# Patient Record
Sex: Male | Born: 1960 | Hispanic: Yes | Marital: Married | Smoking: Never smoker
Health system: Southern US, Community
[De-identification: ages and names within clinical notes are randomized; demographics above are authoritative.]

## PROBLEM LIST (undated history)

## (undated) DIAGNOSIS — I5032 Chronic diastolic (congestive) heart failure: Secondary | ICD-10-CM

## (undated) DIAGNOSIS — N186 End stage renal disease: Secondary | ICD-10-CM

## (undated) DIAGNOSIS — E785 Hyperlipidemia, unspecified: Secondary | ICD-10-CM

## (undated) DIAGNOSIS — E119 Type 2 diabetes mellitus without complications: Secondary | ICD-10-CM

## (undated) DIAGNOSIS — I10 Essential (primary) hypertension: Secondary | ICD-10-CM

## (undated) HISTORY — PX: AV FISTULA PLACEMENT, BRACHIOBASILIC: SHX1206

---

## 2003-11-27 ENCOUNTER — Emergency Department: Payer: Self-pay | Admitting: Internal Medicine

## 2008-08-29 ENCOUNTER — Emergency Department: Payer: Self-pay | Admitting: Unknown Physician Specialty

## 2012-10-06 ENCOUNTER — Emergency Department: Payer: Self-pay | Admitting: Emergency Medicine

## 2012-10-06 LAB — COMPREHENSIVE METABOLIC PANEL
Albumin: 4 g/dL (ref 3.4–5.0)
Alkaline Phosphatase: 111 U/L (ref 50–136)
Anion Gap: 7 (ref 7–16)
BUN: 19 mg/dL — ABNORMAL HIGH (ref 7–18)
Bilirubin,Total: 0.4 mg/dL (ref 0.2–1.0)
Calcium, Total: 9.9 mg/dL (ref 8.5–10.1)
Chloride: 99 mmol/L (ref 98–107)
Co2: 25 mmol/L (ref 21–32)
Creatinine: 0.84 mg/dL (ref 0.60–1.30)
EGFR (African American): >60
Glucose: 353 mg/dL — ABNORMAL HIGH (ref 65–99)
Potassium: 4.3 mmol/L (ref 3.5–5.1)
SGOT(AST): 16 U/L (ref 15–37)
SGPT (ALT): 21 U/L (ref 12–78)

## 2012-10-06 LAB — CBC WITH DIFFERENTIAL/PLATELET
Basophil #: 0.1 10*3/uL (ref 0.0–0.1)
Eosinophil #: 0.1 10*3/uL (ref 0.0–0.7)
Eosinophil %: 1.8 %
HCT: 50 % (ref 40.0–52.0)
HGB: 17.4 g/dL (ref 13.0–18.0)
MCH: 30.6 pg (ref 26.0–34.0)
MCV: 88 fL (ref 80–100)
Monocyte %: 9 %
Neutrophil #: 4 10*3/uL (ref 1.4–6.5)
Neutrophil %: 63.5 %
Platelet: 209 10*3/uL (ref 150–440)
RBC: 5.67 10*6/uL (ref 4.40–5.90)
RDW: 13.7 % (ref 11.5–14.5)
WBC: 6.3 10*3/uL (ref 3.8–10.6)

## 2013-02-07 ENCOUNTER — Ambulatory Visit: Payer: Self-pay | Admitting: Orthopedic Surgery

## 2013-09-19 ENCOUNTER — Emergency Department: Payer: Self-pay | Admitting: Emergency Medicine

## 2013-09-19 LAB — COMPREHENSIVE METABOLIC PANEL
ALBUMIN: 3.8 g/dL (ref 3.4–5.0)
ANION GAP: 11 (ref 7–16)
Alkaline Phosphatase: 85 U/L
BILIRUBIN TOTAL: 0.8 mg/dL (ref 0.2–1.0)
BUN: 18 mg/dL (ref 7–18)
CO2: 25 mmol/L (ref 21–32)
Calcium, Total: 9.2 mg/dL (ref 8.5–10.1)
Chloride: 100 mmol/L (ref 98–107)
Creatinine: 0.87 mg/dL (ref 0.60–1.30)
EGFR (African American): >60
EGFR (Non-African Amer.): >60
GLUCOSE: 224 mg/dL — AB (ref 65–99)
OSMOLALITY: 281 (ref 275–301)
POTASSIUM: 3.7 mmol/L (ref 3.5–5.1)
SGOT(AST): 11 U/L — ABNORMAL LOW (ref 15–37)
SGPT (ALT): 16 U/L
Sodium: 136 mmol/L (ref 136–145)
TOTAL PROTEIN: 7.5 g/dL (ref 6.4–8.2)

## 2013-09-19 LAB — CBC WITH DIFFERENTIAL/PLATELET
Basophil #: 0 10*3/uL (ref 0.0–0.1)
Basophil %: 0.3 %
EOS ABS: 0.1 10*3/uL (ref 0.0–0.7)
Eosinophil %: 0.4 %
HCT: 45.2 % (ref 40.0–52.0)
HGB: 15.7 g/dL (ref 13.0–18.0)
LYMPHS PCT: 7.1 %
Lymphocyte #: 1 10*3/uL (ref 1.0–3.6)
MCH: 30.8 pg (ref 26.0–34.0)
MCHC: 34.8 g/dL (ref 32.0–36.0)
MCV: 89 fL (ref 80–100)
MONOS PCT: 10.1 %
Monocyte #: 1.4 x10 3/mm — ABNORMAL HIGH (ref 0.2–1.0)
Neutrophil #: 11.1 10*3/uL — ABNORMAL HIGH (ref 1.4–6.5)
Neutrophil %: 82.1 %
Platelet: 167 10*3/uL (ref 150–440)
RBC: 5.11 10*6/uL (ref 4.40–5.90)
RDW: 14.1 % (ref 11.5–14.5)
WBC: 13.6 10*3/uL — AB (ref 3.8–10.6)

## 2014-02-27 DIAGNOSIS — E118 Type 2 diabetes mellitus with unspecified complications: Secondary | ICD-10-CM | POA: Diagnosis present

## 2017-06-01 DIAGNOSIS — I10 Essential (primary) hypertension: Secondary | ICD-10-CM | POA: Diagnosis present

## 2018-09-12 ENCOUNTER — Other Ambulatory Visit: Payer: Self-pay

## 2018-09-12 DIAGNOSIS — Z20822 Contact with and (suspected) exposure to covid-19: Secondary | ICD-10-CM

## 2018-09-13 LAB — NOVEL CORONAVIRUS, NAA: SARS-CoV-2, NAA: NOT DETECTED

## 2018-10-15 ENCOUNTER — Other Ambulatory Visit: Payer: Self-pay

## 2018-10-15 DIAGNOSIS — Z20822 Contact with and (suspected) exposure to covid-19: Secondary | ICD-10-CM

## 2018-10-16 LAB — NOVEL CORONAVIRUS, NAA: SARS-CoV-2, NAA: NOT DETECTED

## 2021-01-05 ENCOUNTER — Encounter: Payer: Self-pay | Admitting: Emergency Medicine

## 2021-01-05 ENCOUNTER — Emergency Department
Admission: EM | Admit: 2021-01-05 | Discharge: 2021-01-05 | Disposition: A | Discharge status: Home / Self Care | Payer: 59 | Attending: Emergency Medicine | Admitting: Emergency Medicine

## 2021-01-05 ENCOUNTER — Emergency Department: Payer: 59

## 2021-01-05 ENCOUNTER — Other Ambulatory Visit: Payer: Self-pay

## 2021-01-05 DIAGNOSIS — M545 Low back pain, unspecified: Secondary | ICD-10-CM | POA: Insufficient documentation

## 2021-01-05 DIAGNOSIS — M5441 Lumbago with sciatica, right side: Secondary | ICD-10-CM

## 2021-01-05 DIAGNOSIS — I10 Essential (primary) hypertension: Secondary | ICD-10-CM | POA: Diagnosis not present

## 2021-01-05 DIAGNOSIS — E119 Type 2 diabetes mellitus without complications: Secondary | ICD-10-CM | POA: Diagnosis not present

## 2021-01-05 DIAGNOSIS — M79604 Pain in right leg: Secondary | ICD-10-CM | POA: Insufficient documentation

## 2021-01-05 HISTORY — DX: Essential (primary) hypertension: I10

## 2021-01-05 HISTORY — DX: Hyperlipidemia, unspecified: E78.5

## 2021-01-05 HISTORY — DX: Type 2 diabetes mellitus without complications: E11.9

## 2021-01-05 MED ORDER — PREDNISONE 20 MG PO TABS: 40.0000 mg | ORAL_TABLET | Freq: Every day | ORAL | 0 refills | Status: AC

## 2021-01-05 MED ORDER — IBUPROFEN 600 MG PO TABS: 600.0000 mg | ORAL_TABLET | Freq: Four times a day (QID) | ORAL | 0 refills | Status: DC | PRN

## 2021-01-05 MED ORDER — TRAMADOL HCL 50 MG PO TABS
50.0000 mg | ORAL_TABLET | Freq: Once | ORAL | Status: AC
  Administered 2021-01-05: 08:00:00 50 mg via ORAL
  Filled 2021-01-05: qty 1

## 2021-01-05 MED ORDER — IBUPROFEN 600 MG PO TABS
600.0000 mg | ORAL_TABLET | Freq: Once | ORAL | Status: AC
  Administered 2021-01-05: 08:00:00 600 mg via ORAL
  Filled 2021-01-05: qty 1

## 2021-01-05 MED ORDER — TRAMADOL HCL 50 MG PO TABS: 50.0000 mg | ORAL_TABLET | Freq: Four times a day (QID) | ORAL | 0 refills | Status: AC | PRN

## 2021-01-05 NOTE — ED Provider Notes (Signed)
[facility] Emergency Department Provider Note ____________________________________________   Event Date/Time   First MD Initiated Contact with Patient 01/05/21 [phone]     (approximate)  I have reviewed the triage vital signs and the nursing notes.   HISTORY  Chief Complaint Back Pain  HPI and ROS obtained via interpretation by the patient's son at the patient's request  HPI Joshua Galvan is a 60 y.o. male with PMH as noted below who presents with back pain, gradual onset yesterday but acutely worsened early this morning around 1 AM.  The pain is in the bilateral lower back although worse on the right.  It radiates down to the right leg and he feels some tingling and numbness in the right leg.  He denies any weakness in the legs and has no urinary retention or incontinence.  He denies any preceding trauma or injury.  He has had occasional milder back pain.  He has not taken anything for this at home.  Past Medical History:  Diagnosis Date   Diabetes mellitus without complication (HCC)    Hyperlipidemia    Hypertension     There are no problems to display for this patient.   History reviewed. No pertinent surgical history.  Prior to Admission medications   Medication Sig Start Date End Date Taking? Authorizing Provider  ibuprofen (ADVIL) 600 MG tablet Take 1 tablet (600 mg total) by mouth every 6 (six) hours as needed. 01/05/21  Yes Dionne Bucy, MD  predniSONE (DELTASONE) 20 MG tablet Take 2 tablets (40 mg total) by mouth daily for 5 days. 01/05/21 01/10/21 Yes Dionne Bucy, MD  traMADol (ULTRAM) 50 MG tablet Take 1 tablet (50 mg total) by mouth every 6 (six) hours as needed for up to 3 days for severe pain. 01/05/21 01/08/21 Yes Dionne Bucy, MD    Allergies Patient has no known allergies.  History reviewed. No pertinent family history.  Social History Social History   Tobacco Use   Smoking status: Never    Smokeless tobacco: Never  Substance Use Topics   Alcohol use: Yes    Comment: weekends   Drug use: Never    Review of Systems  Constitutional: No fever/chills Eyes: No visual changes. ENT: No sore throat. Cardiovascular: Denies chest pain. Respiratory: Denies shortness of breath. Gastrointestinal: No vomiting or diarrhea.  Genitourinary: Negative for dysuria, incontinence or retention.  Musculoskeletal: Positive for back pain. Skin: Negative for rash. Neurological: Positive for right leg paresthesias.  ____________________________________________   PHYSICAL EXAM:  VITAL SIGNS: ED Triage Vitals  Enc Vitals Group     BP 01/05/21 0648 (!) 182/98     Pulse Rate 01/05/21 0648 88     Resp 01/05/21 0648 18     Temp 01/05/21 0648 98.9 F (37.2 C)     Temp Source 01/05/21 0648 Oral     SpO2 01/05/21 0648 97 %     Weight 01/05/21 0648 170 lb (77.1 kg)     Height 01/05/21 0648 5\' 11"  (1.803 m)     Head Circumference --      Peak Flow --      Pain Score 01/05/21 0648 9     Pain Loc --      Pain Edu? --      Excl. in GC? --     Constitutional: Alert and oriented. Well appearing and in no acute distress. Eyes: Conjunctivae are normal.  Head: Atraumatic. Nose: No congestion/rhinnorhea. Mouth/Throat: Mucous membranes are moist.   Neck: Normal  range of motion.  Cardiovascular: Normal rate, regular rhythm. Good peripheral circulation. Respiratory: Normal respiratory effort.   Gastrointestinal: No distention.  Genitourinary: No CVA tenderness. Musculoskeletal: Mild lumbar midline and bilateral paraspinal tenderness.  No step-off or crepitus.  Negative straight leg raise bilaterally.  Full range of motion of bilateral hips and knees. Neurologic:  Normal speech and language.  5/5 motor strength and intact sensation proximal and distal to bilateral lower extremities. Skin:  Skin is warm and dry. No rash noted. Psychiatric: Mood and affect are normal. Speech and behavior are  normal.  ____________________________________________   LABS (all labs ordered are listed, but only abnormal results are displayed)  Labs Reviewed - No data to display ____________________________________________  EKG   ____________________________________________  RADIOLOGY  XR lumbar spine:   IMPRESSION:  Degenerative changes lumbar spine.     No acute abnormalities.   ____________________________________________   PROCEDURES  Procedure(s) performed: No  Procedures  Critical Care performed: No ____________________________________________   INITIAL IMPRESSION / ASSESSMENT AND PLAN / ED COURSE  Pertinent labs & imaging results that were available during my care of the patient were reviewed by me and considered in my medical decision making (see chart for details).   60 year old male with PMH as noted above presents with atraumatic lumbar pain radiating down his right leg and associated with some paresthesias.  On exam he is hypertensive with otherwise normal vital signs.  Straight leg raise test is negative bilaterally, and neurologic exam is normal.  Differential includes muscle strain or spasm, radiculopathy, sciatica.  The patient has no red flags and no clinical evidence of cauda equina syndrome.  We will obtain x-rays of the lumbar spine and give analgesia and steroid.  ----------------------------------------- 8:57 AM on 01/05/2021 -----------------------------------------  X-ray just shows some degenerative changes.  The patient is stable for discharge home.  I counseled him and his son on the results of the work-up and plan of care.  I will prescribe prednisone and ibuprofen, along with a small quantity of tramadol for breakthrough pain.  I gave them very thorough return precautions and they expressed understanding.  Patient will follow up with his PMD.  ____________________________________________   FINAL CLINICAL IMPRESSION(S) / ED DIAGNOSES  Final  diagnoses:  Acute bilateral low back pain with right-sided sciatica      NEW MEDICATIONS STARTED DURING THIS VISIT:  New Prescriptions   IBUPROFEN (ADVIL) 600 MG TABLET    Take 1 tablet (600 mg total) by mouth every 6 (six) hours as needed.   PREDNISONE (DELTASONE) 20 MG TABLET    Take 2 tablets (40 mg total) by mouth daily for 5 days.   TRAMADOL (ULTRAM) 50 MG TABLET    Take 1 tablet (50 mg total) by mouth every 6 (six) hours as needed for up to 3 days for severe pain.     Note:  This document was prepared using Dragon voice recognition software and may include unintentional dictation errors.    Dionne Bucy, MD 01/05/21 [phone]

## 2021-01-05 NOTE — Discharge Instructions (Addendum)
Tome la prednisona diariamente durante 5 das para disminuir la inflamacin. Tome el ibuprofeno hasta cada 6 horas para Chief Technology Officer. Tome el tramadol solo si es necesario para Chief Technology Officer que no se alivia con los otros medicamentos. No conduzca ni maneje maquinaria mientras est tomando tramadol.

## 2021-01-05 NOTE — ED Triage Notes (Signed)
Pt to ED from home c/o lower back pain since yesterday and getting worse today.  States pain radiates down right leg.  Gets some relief after moving around some.  Denies injury, denies urinary changes.  Pt A&Ox4, chest rise even and unlabored, skin WNL and in NAD at this time.

## 2022-02-11 DIAGNOSIS — N1831 Chronic kidney disease, stage 3a: Secondary | ICD-10-CM | POA: Diagnosis present

## 2022-02-11 DIAGNOSIS — E785 Hyperlipidemia, unspecified: Secondary | ICD-10-CM | POA: Diagnosis present

## 2023-02-26 ENCOUNTER — Emergency Department: Payer: Commercial Managed Care - PPO

## 2023-02-26 ENCOUNTER — Other Ambulatory Visit: Payer: Self-pay

## 2023-02-26 DIAGNOSIS — Z20822 Contact with and (suspected) exposure to covid-19: Secondary | ICD-10-CM | POA: Insufficient documentation

## 2023-02-26 DIAGNOSIS — J101 Influenza due to other identified influenza virus with other respiratory manifestations: Secondary | ICD-10-CM | POA: Insufficient documentation

## 2023-02-26 DIAGNOSIS — R059 Cough, unspecified: Secondary | ICD-10-CM | POA: Diagnosis present

## 2023-02-26 NOTE — ED Triage Notes (Signed)
Pt developed cough congestion sore throat for the past 8 days.

## 2023-02-27 ENCOUNTER — Emergency Department
Admission: EM | Admit: 2023-02-27 | Discharge: 2023-02-27 | Disposition: A | Discharge status: Home / Self Care | Payer: Commercial Managed Care - PPO | Attending: Emergency Medicine | Admitting: Emergency Medicine

## 2023-02-27 DIAGNOSIS — J101 Influenza due to other identified influenza virus with other respiratory manifestations: Secondary | ICD-10-CM

## 2023-02-27 LAB — GROUP A STREP BY PCR: Group A Strep by PCR: NOT DETECTED

## 2023-02-27 LAB — RESP PANEL BY RT-PCR (RSV, FLU A&B, COVID)  RVPGX2
Influenza A by PCR: POSITIVE — AB
Influenza B by PCR: NEGATIVE
Resp Syncytial Virus by PCR: NEGATIVE
SARS Coronavirus 2 by RT PCR: NEGATIVE

## 2023-02-27 MED ORDER — KETOROLAC TROMETHAMINE 30 MG/ML IJ SOLN
30.0000 mg | Freq: Once | INTRAMUSCULAR | Status: AC
  Administered 2023-02-27: 30 mg via INTRAMUSCULAR
  Filled 2023-02-27: qty 1

## 2023-02-27 MED ORDER — BUTALBITAL-APAP-CAFFEINE 50-325-40 MG PO TABS: 1.0000 | ORAL_TABLET | Freq: Four times a day (QID) | ORAL | 0 refills | Status: AC | PRN

## 2023-02-27 NOTE — ED Provider Notes (Signed)
 [facility] Provider Note    Event Date/Time   First MD Initiated Contact with Patient 02/27/23 [phone]     (approximate)   History   Cough   HPI  Joshua Galvan is a 63 y.o. male who presents with complaints of mild cough, headache, sore throat, body aches, fatigue over the last 3 to 4 days.  Denies shortness of breath.  No neurodeficits.  Per review of records, chronic hypertension, poorly controlled     Physical Exam   Triage Vital Signs: ED Triage Vitals  Encounter Vitals Group     BP 02/26/23 2345 (!) 187/93     Systolic BP Percentile --      Diastolic BP Percentile --      Pulse Rate 02/26/23 2345 87     Resp 02/26/23 2345 20     Temp 02/26/23 2345 98.4 F (36.9 C)     Temp Source 02/26/23 2345 Oral     SpO2 02/26/23 2345 99 %     Weight 02/26/23 2344 79.4 kg (175 lb)     Height 02/26/23 2344 1.803 m (5' 11)     Head Circumference --      Peak Flow --      Pain Score 02/26/23 2343 9     Pain Loc --      Pain Education --      Exclude from Growth Chart --     Most recent vital signs: Vitals:   02/27/23 0414 02/27/23 0720  BP: (!) 189/96 (!) 180/90  Pulse: 88 80  Resp: 18 18  Temp: 99 F (37.2 C) 99.1 F (37.3 C)  SpO2: 99% 99%     General: Awake, no distress.  CV:  Good peripheral perfusion.  Regular rate and rhythm Resp:  Normal effort.  Clear to auscultation bilaterally Abd:  No distention.  Other:  Normal neurologic exam, cranial nerves II through XII are normal   ED Results / Procedures / Treatments   Labs (all labs ordered are listed, but only abnormal results are displayed) Labs Reviewed  RESP PANEL BY RT-PCR (RSV, FLU A&B, COVID)  RVPGX2 - Abnormal; Notable for the following components:      Result Value   Influenza A by PCR POSITIVE (*)    All other components within normal limits  GROUP A STREP BY PCR     EKG     RADIOLOGY Chest x-ray viewed interpreted by me, no  pneumonia    PROCEDURES:  Critical Care performed:   Procedures   MEDICATIONS ORDERED IN ED: Medications  ketorolac  (TORADOL ) 30 MG/ML injection 30 mg (30 mg Intramuscular Given 02/27/23 0742)     IMPRESSION / MDM / ASSESSMENT AND PLAN / ED COURSE  I reviewed the triage vital signs and the nursing notes. Patient's presentation is most consistent with acute illness / injury with system symptoms.  Patient presents with symptoms as above suspicious for viral illness, vital signs overall reassuring despite chronic hypertension, not significantly changed from prior  Neurologic exam reassuring, mild elevation of temperature, influenza A positive on PCR, strep negative, chest x-ray reassuring  Will treat with IM Toradol , appropriate for outpatient management, return precautions discussed, patient reports he takes losartan for his blood pressure, recommended close follow-up with PCP for recheck after feeling improved to reevaluate blood pressure.        FINAL CLINICAL IMPRESSION(S) / ED DIAGNOSES   Final diagnoses:  Influenza A     Rx / DC Orders  ED Discharge Orders     None        Note:  This document was prepared using Dragon voice recognition software and may include unintentional dictation errors.   Arlander Charleston, MD 02/27/23 [phone]

## 2023-03-01 ENCOUNTER — Emergency Department (HOSPITAL_COMMUNITY): Payer: Commercial Managed Care - PPO

## 2023-03-01 ENCOUNTER — Inpatient Hospital Stay (HOSPITAL_COMMUNITY)
Admission: EM | Admit: 2023-03-01 | Discharge: 2023-03-06 | DRG: 683 | Disposition: A | Discharge status: Home / Self Care | Payer: Commercial Managed Care - PPO | Attending: Internal Medicine | Admitting: Internal Medicine

## 2023-03-01 ENCOUNTER — Encounter (HOSPITAL_COMMUNITY): Payer: Self-pay | Admitting: Emergency Medicine

## 2023-03-01 DIAGNOSIS — E860 Dehydration: Secondary | ICD-10-CM | POA: Diagnosis present

## 2023-03-01 DIAGNOSIS — N170 Acute kidney failure with tubular necrosis: Principal | ICD-10-CM | POA: Diagnosis present

## 2023-03-01 DIAGNOSIS — I129 Hypertensive chronic kidney disease with stage 1 through stage 4 chronic kidney disease, or unspecified chronic kidney disease: Secondary | ICD-10-CM | POA: Diagnosis present

## 2023-03-01 DIAGNOSIS — J320 Chronic maxillary sinusitis: Secondary | ICD-10-CM | POA: Diagnosis present

## 2023-03-01 DIAGNOSIS — H70002 Acute mastoiditis without complications, left ear: Secondary | ICD-10-CM | POA: Diagnosis present

## 2023-03-01 DIAGNOSIS — E1122 Type 2 diabetes mellitus with diabetic chronic kidney disease: Secondary | ICD-10-CM | POA: Diagnosis present

## 2023-03-01 DIAGNOSIS — I10 Essential (primary) hypertension: Secondary | ICD-10-CM | POA: Diagnosis present

## 2023-03-01 DIAGNOSIS — E785 Hyperlipidemia, unspecified: Secondary | ICD-10-CM | POA: Diagnosis present

## 2023-03-01 DIAGNOSIS — J101 Influenza due to other identified influenza virus with other respiratory manifestations: Secondary | ICD-10-CM | POA: Diagnosis present

## 2023-03-01 DIAGNOSIS — E861 Hypovolemia: Secondary | ICD-10-CM | POA: Diagnosis present

## 2023-03-01 DIAGNOSIS — J323 Chronic sphenoidal sinusitis: Secondary | ICD-10-CM | POA: Diagnosis present

## 2023-03-01 DIAGNOSIS — E118 Type 2 diabetes mellitus with unspecified complications: Secondary | ICD-10-CM | POA: Diagnosis present

## 2023-03-01 DIAGNOSIS — N1831 Chronic kidney disease, stage 3a: Secondary | ICD-10-CM | POA: Diagnosis present

## 2023-03-01 DIAGNOSIS — N179 Acute kidney failure, unspecified: Principal | ICD-10-CM | POA: Diagnosis present

## 2023-03-01 LAB — CBC WITH DIFFERENTIAL/PLATELET
Abs Immature Granulocytes: 0.09 10*3/uL — ABNORMAL HIGH (ref 0.00–0.07)
Basophils Absolute: 0.1 10*3/uL (ref 0.0–0.1)
Basophils Relative: 1 %
Eosinophils Absolute: 0.2 10*3/uL (ref 0.0–0.5)
Eosinophils Relative: 2 %
HCT: 32 % — ABNORMAL LOW (ref 39.0–52.0)
Hemoglobin: 10.8 g/dL — ABNORMAL LOW (ref 13.0–17.0)
Immature Granulocytes: 1 %
Lymphocytes Relative: 12 %
Lymphs Abs: 1.6 10*3/uL (ref 0.7–4.0)
MCH: 29.7 pg (ref 26.0–34.0)
MCHC: 33.8 g/dL (ref 30.0–36.0)
MCV: 87.9 fL (ref 80.0–100.0)
Monocytes Absolute: 1.1 10*3/uL — ABNORMAL HIGH (ref 0.1–1.0)
Monocytes Relative: 9 %
Neutro Abs: 9.7 10*3/uL — ABNORMAL HIGH (ref 1.7–7.7)
Neutrophils Relative %: 75 %
Platelets: 306 10*3/uL (ref 150–400)
RBC: 3.64 MIL/uL — ABNORMAL LOW (ref 4.22–5.81)
RDW: 13.1 % (ref 11.5–15.5)
WBC: 12.8 10*3/uL — ABNORMAL HIGH (ref 4.0–10.5)
nRBC: 0 % (ref 0.0–0.2)

## 2023-03-01 LAB — HEPATIC FUNCTION PANEL
ALT: 12 U/L (ref 0–44)
AST: 15 U/L (ref 15–41)
Albumin: 2.4 g/dL — ABNORMAL LOW (ref 3.5–5.0)
Alkaline Phosphatase: 104 U/L (ref 38–126)
Bilirubin, Direct: <0.1 mg/dL (ref 0.0–0.2)
Total Bilirubin: 0.5 mg/dL (ref 0.0–1.2)
Total Protein: 6.4 g/dL — ABNORMAL LOW (ref 6.5–8.1)

## 2023-03-01 LAB — BASIC METABOLIC PANEL
Anion gap: 10 (ref 5–15)
BUN: 42 mg/dL — ABNORMAL HIGH (ref 8–23)
CO2: 19 mmol/L — ABNORMAL LOW (ref 22–32)
Calcium: 8.6 mg/dL — ABNORMAL LOW (ref 8.9–10.3)
Chloride: 107 mmol/L (ref 98–111)
Creatinine, Ser: 3.39 mg/dL — ABNORMAL HIGH (ref 0.61–1.24)
GFR, Estimated: 20 mL/min — ABNORMAL LOW (ref >60)
Glucose, Bld: 187 mg/dL — ABNORMAL HIGH (ref 70–99)
Potassium: 4.2 mmol/L (ref 3.5–5.1)
Sodium: 136 mmol/L (ref 135–145)

## 2023-03-01 LAB — GLUCOSE, CAPILLARY: Glucose-Capillary: 127 mg/dL — ABNORMAL HIGH (ref 70–99)

## 2023-03-01 MED ORDER — ACETAMINOPHEN 325 MG PO TABS
650.0000 mg | ORAL_TABLET | Freq: Four times a day (QID) | ORAL | Status: DC | PRN
  Administered 2023-03-02 (×2): 650 mg via ORAL
  Filled 2023-03-01 (×2): qty 2

## 2023-03-01 MED ORDER — ACETAMINOPHEN 500 MG PO TABS
1000.0000 mg | ORAL_TABLET | Freq: Once | ORAL | Status: AC
  Administered 2023-03-01: 1000 mg via ORAL
  Filled 2023-03-01: qty 2

## 2023-03-01 MED ORDER — HEPARIN SODIUM (PORCINE) 5000 UNIT/ML IJ SOLN
5000.0000 [IU] | Freq: Three times a day (TID) | INTRAMUSCULAR | Status: DC
  Administered 2023-03-01 – 2023-03-06 (×14): 5000 [IU] via SUBCUTANEOUS
  Filled 2023-03-01 (×14): qty 1

## 2023-03-01 MED ORDER — INSULIN ASPART 100 UNIT/ML IJ SOLN: 0.0000 [IU] | Freq: Every day | INTRAMUSCULAR | Status: DC

## 2023-03-01 MED ORDER — DOCUSATE SODIUM 283 MG RE ENEM: 1.0000 | ENEMA | RECTAL | Status: DC | PRN

## 2023-03-01 MED ORDER — NEPRO/CARBSTEADY PO LIQD: 237.0000 mL | Freq: Three times a day (TID) | ORAL | Status: DC | PRN

## 2023-03-01 MED ORDER — ACETAMINOPHEN 650 MG RE SUPP: 650.0000 mg | Freq: Four times a day (QID) | RECTAL | Status: DC | PRN

## 2023-03-01 MED ORDER — SODIUM CHLORIDE 0.9 % IV SOLN: INTRAVENOUS | Status: DC

## 2023-03-01 MED ORDER — CALCIUM CARBONATE ANTACID 1250 MG/5ML PO SUSP: 500.0000 mg | Freq: Four times a day (QID) | ORAL | Status: DC | PRN

## 2023-03-01 MED ORDER — LACTATED RINGERS IV BOLUS
1000.0000 mL | Freq: Once | INTRAVENOUS | Status: AC
  Administered 2023-03-01: 1000 mL via INTRAVENOUS

## 2023-03-01 MED ORDER — SODIUM CHLORIDE 0.9 % IV SOLN
1.0000 g | INTRAVENOUS | Status: DC
  Administered 2023-03-02: 1 g via INTRAVENOUS
  Filled 2023-03-01: qty 10

## 2023-03-01 MED ORDER — CAMPHOR-MENTHOL 0.5-0.5 % EX LOTN: 1.0000 | TOPICAL_LOTION | Freq: Three times a day (TID) | CUTANEOUS | Status: DC | PRN

## 2023-03-01 MED ORDER — ONDANSETRON HCL 4 MG PO TABS: 4.0000 mg | ORAL_TABLET | Freq: Four times a day (QID) | ORAL | Status: DC | PRN

## 2023-03-01 MED ORDER — ONDANSETRON HCL 4 MG/2ML IJ SOLN
4.0000 mg | Freq: Four times a day (QID) | INTRAMUSCULAR | Status: DC | PRN
  Administered 2023-03-04: 4 mg via INTRAVENOUS
  Filled 2023-03-01: qty 2

## 2023-03-01 MED ORDER — ACETAMINOPHEN 500 MG PO TABS
ORAL_TABLET | ORAL | Status: AC
  Filled 2023-03-01: qty 2

## 2023-03-01 MED ORDER — INSULIN ASPART 100 UNIT/ML IJ SOLN
0.0000 [IU] | Freq: Three times a day (TID) | INTRAMUSCULAR | Status: DC
  Administered 2023-03-02: 5 [IU] via SUBCUTANEOUS
  Administered 2023-03-02: 3 [IU] via SUBCUTANEOUS
  Administered 2023-03-03: 11 [IU] via SUBCUTANEOUS
  Administered 2023-03-04 (×2): 2 [IU] via SUBCUTANEOUS
  Administered 2023-03-04: 3 [IU] via SUBCUTANEOUS
  Administered 2023-03-05: 2 [IU] via SUBCUTANEOUS
  Administered 2023-03-05: 3 [IU] via SUBCUTANEOUS
  Administered 2023-03-05: 5 [IU] via SUBCUTANEOUS

## 2023-03-01 MED ORDER — ACETAMINOPHEN 500 MG PO TABS
1000.0000 mg | ORAL_TABLET | Freq: Once | ORAL | Status: DC
  Filled 2023-03-01: qty 2

## 2023-03-01 MED ORDER — HYDROXYZINE HCL 25 MG PO TABS: 25.0000 mg | ORAL_TABLET | Freq: Three times a day (TID) | ORAL | Status: DC | PRN

## 2023-03-01 MED ORDER — SORBITOL 70 % SOLN: 30.0000 mL | Status: DC | PRN

## 2023-03-01 NOTE — ED Triage Notes (Signed)
 Pt here sent from from MD office with c/o htn , pt recently dx with flu has not not missed any of his b/p meds

## 2023-03-01 NOTE — ED Provider Triage Note (Signed)
 Emergency Medicine Provider Triage Evaluation Note  Melody Huck Ashworth , a 63 y.o. male  was evaluated in triage.  Pt complains of acutely painful headache, congestion, cough x 4 days. Saw PCP today and told to get head CT.  Dx w/ flue 02/27/2023. Has taken all BP meds today.  Denies fevers, blurry vision, vertigo, chest pain, shortness of breath, abdominal pain, dysuria, hesitancy, LE pain, LE swelling, instability, weakness.   Review of Systems  Positive: N/a Negative: N/a  Physical Exam  BP (!) 194/94   Pulse 88   Temp 98.4 F (36.9 C)   Resp 18   SpO2 100%  Gen:   Awake, no distress   Resp:  Normal effort  MSK:   Moves extremities without difficulty  Other:    Medical Decision Making  Medically screening exam initiated at 12:46 PM.  Appropriate orders placed.  Treylin Rayner Erman was informed that the remainder of the evaluation will be completed by another provider, this initial triage assessment does not replace that evaluation, and the importance of remaining in the ED until their evaluation is complete.     Beola Terrall RAMAN, [place] 03/01/23 1253

## 2023-03-01 NOTE — ED Provider Notes (Signed)
 [name] EMERGENCY DEPARTMENT AT [facility] Provider Note   CSN: [id number] Arrival date & time: 03/01/23  1234     History Chief Complaint  Patient presents with   Influenza   Hypertension    HPI Joshua Galvan is a 63 y.o. male presenting for chief complaint of elevated BP. Recently diagnosed with flu. Has been having a substantial amount of headache. HX of HBP on   Patient's recorded medical, surgical, social, medication list and allergies were reviewed in the Snapshot window as part of the initial history.   Review of Systems   Review of Systems  Constitutional:  Positive for fatigue and fever. Negative for chills.  HENT:  Positive for congestion. Negative for ear pain and sore throat.   Eyes:  Negative for pain and visual disturbance.  Respiratory:  Positive for cough and shortness of breath.   Cardiovascular:  Negative for chest pain and palpitations.  Gastrointestinal:  Negative for abdominal pain and vomiting.  Genitourinary:  Negative for dysuria and hematuria.  Musculoskeletal:  Negative for arthralgias and back pain.  Skin:  Negative for color change and rash.  Neurological:  Negative for seizures and syncope.  All other systems reviewed and are negative.   Physical Exam Updated Vital Signs BP (!) 157/87   Pulse 86   Temp 98.7 F (37.1 C)   Resp 14   SpO2 100%  Physical Exam Vitals and nursing note reviewed.  Constitutional:      General: He is not in acute distress.    Appearance: He is well-developed.  HENT:     Head: Normocephalic and atraumatic.  Eyes:     Conjunctiva/sclera: Conjunctivae normal.  Cardiovascular:     Rate and Rhythm: Normal rate and regular rhythm.     Heart sounds: No murmur heard. Pulmonary:     Effort: Pulmonary effort is normal. No respiratory distress.     Breath sounds: Normal breath sounds.  Abdominal:     Palpations: Abdomen is soft.     Tenderness: There is no abdominal tenderness.   Musculoskeletal:        General: No swelling.     Cervical back: Neck supple.  Skin:    General: Skin is warm and dry.     Capillary Refill: Capillary refill takes less than 2 seconds.  Neurological:     Mental Status: He is alert.  Psychiatric:        Mood and Affect: Mood normal.      ED Course/ Medical Decision Making/ A&P    Procedures Procedures   Medications Ordered in ED Medications  acetaminophen  (TYLENOL ) tablet 1,000 mg (has no administration in time range)  lactated ringers  bolus 1,000 mL (has no administration in time range)   Medical Decision Making:   Joshua Galvan is a 63 y.o. male who presented to the ED today with altered mental status detailed above.    Additional history discussed with patient's family/caregivers.  Patient placed on continuous vitals and telemetry monitoring while in ED which was reviewed periodically.  Complete initial physical exam performed, notably the patient  was HDS in NAD.    Reviewed and confirmed nursing documentation for past medical history, family history, social history.   TELEMEDICAL INTERPRETER USED FOR ALL ENCOUNTERS.  Initial Assessment:   With the patient's presentation of altered mental status, most likely diagnosis is delerium 2/2 infectious etiology (UTI/CAP/URI) vs metabolic abnormality (Na/K/Mg/Ca) vs nonspecific etiology. Other diagnoses were considered including (but not limited to) CVA, ICH,  intracranial mass, critical dehydration, heptatic dysfunction, uremia, hypercarbia, intoxication, endrocrine abnormality, toxidrome. These are considered less likely due to history of present illness and physical exam findings.   This is most consistent with an acute life/limb threatening illness complicated by underlying chronic conditions.  Initial Plan:  CTH to evaluate for intracranial etiology of patient's symptoms  Screening labs including CBC and Metabolic panel to evaluate for infectious or metabolic  etiology of disease.  Urinalysis with reflex culture ordered to evaluate for UTI or relevant urologic/nephrologic pathology.  CXR to evaluate for structural/infectious intrathoracic pathology.  EKG to evaluate for cardiac pathology Objective evaluation as below reviewed   Initial Study Results:   Laboratory  All laboratory results reviewed without evidence of clinically relevant pathology.    EKG EKG was reviewed independently. Rate, rhythm, axis, intervals all examined and without medically relevant abnormality. ST segments without concerns for elevations.    Radiology:  All images reviewed independently. Agree with radiology report at this time.   CT Head Wo Contrast Result Date: 03/01/2023 CLINICAL DATA:  Hypertension, headache EXAM: CT HEAD WITHOUT CONTRAST TECHNIQUE: Contiguous axial images were obtained from the base of the skull through the vertex without intravenous contrast. RADIATION DOSE REDUCTION: This exam was performed according to the departmental dose-optimization program which includes automated exposure control, adjustment of the mA and/or kV according to patient size and/or use of iterative reconstruction technique. COMPARISON:  None Available. FINDINGS: Brain: No evidence of acute infarction, hemorrhage, mass, mass effect, or midline shift. No hydrocephalus or extra-axial fluid collection. Vascular: No hyperdense vessel. Skull: Negative for fracture or focal lesion. Sinuses/Orbits: Near complete opacification of the left maxillary sinus, with osseous thickening of the left maxillary sinus walls. Opacification of the left greater than right ethmoid air cells and left greater than right sphenoid sinus, with air-fluid levels in the left sphenoid sinus, and right maxillary sinus. No acute finding in the orbits. Other: Fluid in the left mastoid air cells and middle ear. Trace fluid in the right mastoid air cells. IMPRESSION: 1. No acute intracranial process. 2. Fluid in the left mastoid  air cells and middle ear, which could represent otomastoiditis. 3. Paranasal sinus disease, with air-fluid levels in the left sphenoid sinus and right maxillary sinus, which can be seen in the setting of acute sinusitis. Osseous thickening of the left maxillary sinus suggests underlying chronic left maxillary sinusitis. Electronically Signed   By: Donald Campion M.D.   On: 03/01/2023 13:58   DG Chest 2 View Result Date: 02/27/2023 CLINICAL DATA:  Cough, congestion, and sore throat for 8 days. EXAM: CHEST - 2 VIEW COMPARISON:  CT chest 08/29/2008 FINDINGS: Shallow inspiration. Heart size and pulmonary vascularity are normal. Lungs are clear. No pleural effusion or pneumothorax. Mediastinal contours appear intact. Degenerative changes in the spine. IMPRESSION: No active cardiopulmonary disease. Electronically Signed   By: Elsie Gravely M.D.   On: 02/27/2023 00:07   Final Assessment and Plan:   Patient's objective findings reveal an acute kidney injury that is relatively substantial. Does not appear to be prerenal, will add on a renal ultrasound, urinalysis and consult with medicine for definitive care management. Disposition:   Based on the above findings, I believe this patient is stable for admission.    Patient/family educated about specific findings on our evaluation and explained exact reasons for admission.  Patient/family educated about clinical situation and time was allowed to answer questions.   Admission team communicated with and agreed with need for admission. Patient admitted. Patient  ready to move at this time.     Emergency Department Medication Summary:   Medications  acetaminophen  (TYLENOL ) tablet 1,000 mg (has no administration in time range)  lactated ringers  bolus 1,000 mL (has no administration in time range)          Clinical Impression:  1. AKI (acute kidney injury) (HCC)      Admit   Final Clinical Impression(s) / ED Diagnoses Final diagnoses:  AKI (acute  kidney injury) [facility])    Rx / DC Orders ED Discharge Orders     None         Jerral Meth, MD 03/01/23 1851

## 2023-03-01 NOTE — H&P (Signed)
 History and Physical    Patient: Joshua Galvan FMW:[id number] DOB: 1960/05/26 DOA: 03/01/2023 DOS: the patient was seen and examined on 03/01/2023 PCP: System, Provider Not In  Patient coming from: Home  Chief Complaint:  Chief Complaint  Patient presents with   Influenza   Hypertension   HPI: Joshua Galvan is a 63 y.o. male with medical history significant of essential hypertension, hyperlipidemia, diabetes type 2, who was recently diagnosed with influenza A infection and has been weak for the last week.  In the last 4 days patient has been drinking some soup only.  Has not been drinking or eating adequately.  He is gradually become weak and patient was brought in by family.  He has been complaining of substantial headache for which he was getting treatment.  He was so fatigued and congested.  Also some cough and shortness of breath.  In the ER patient was seen and evaluated.  He appears to have significant dehydration and AKI.  Baseline creatinine was less than 1 but today is 3.39.  Also some leukocytosis and anemia.  Patient is being admitted with AKI most likely following his influenza infection.  Review of Systems: As mentioned in the history of present illness. All other systems reviewed and are negative. Past Medical History:  Diagnosis Date   Diabetes mellitus without complication (HCC)    Hyperlipidemia    Hypertension    History reviewed. No pertinent surgical history. Social History:  reports that he has never smoked. He has never used smokeless tobacco. He reports current alcohol use. He reports that he does not use drugs.  No Known Allergies  History reviewed. No pertinent family history.  Prior to Admission medications   Medication Sig Start Date End Date Taking? Authorizing Provider  butalbital -acetaminophen -caffeine  (FIORICET ) 50-325-40 MG tablet Take 1-2 tablets by mouth every 6 (six) hours as needed for headache. 02/27/23 02/27/24  Arlander Charleston, MD  ibuprofen  (ADVIL ) 600 MG tablet Take 1 tablet (600 mg total) by mouth every 6 (six) hours as needed. 01/05/21   Jacolyn Pae, MD    Physical Exam: Vitals:   03/01/23 1239 03/01/23 1711 03/01/23 1715  BP: (!) 194/94 (!) 176/92 (!) 157/87  Pulse: 88 88 86  Resp: 18  14  Temp: 98.4 F (36.9 C)  98.7 F (37.1 C)  SpO2: 100% 97% 100%   Constitutional: Acutely ill looking, weak, NAD, calm, comfortable Eyes: PERRL, lids and conjunctivae normal ENMT: Mucous membranes are dry.  Posterior pharynx clear of any exudate or lesions.Normal dentition.  Neck: normal, supple, no masses, no thyromegaly Respiratory: clear to auscultation bilaterally, no wheezing, no crackles. Normal respiratory effort. No accessory muscle use.  Cardiovascular: Regular rate and rhythm, no murmurs / rubs / gallops. No extremity edema. 2+ pedal pulses. No carotid bruits.  Abdomen: no tenderness, no masses palpated. No hepatosplenomegaly. Bowel sounds positive.  Musculoskeletal: Good range of motion, no joint swelling or tenderness, Skin: no rashes, lesions, ulcers. No induration Neurologic: CN 2-12 grossly intact. Sensation intact, DTR normal. Strength 5/5 in all 4.  Psychiatric: Normal judgment and insight. Alert and oriented x 3. Normal mood  Data Reviewed:  Vitals largely stable blood pressure 194/94, CO2 19 creatinine 3.39 with BUN 42.  Calcium  is 8.6.  White count 12.8 hemoglobin 10.8.  Respiratory panel is positive for influenza A.  Chest x-ray showed no acute findings.  There is fluid in the left mastoid air cells and middle ear probably otomastoiditis.  Assessment and Plan:  #1 AKI:  Most likely prerenal in nature.  Patient will be admitted.  Aggressive hydration.  Follow renal function closely.  Oral intake hopefully will resume and patient will be able to keep up with fluids.  #2 severe headache: Likely secondary to otomastoiditis.  Empiric antibiotics will be initiated.  #3 diabetes:  Sliding scale insulin .  #4 uncontrolled hypertension: Resume home regimen and adjust.  #5 influenza A infection: Patient may have passed the window for Tamiflu  to be effective.  Supportive care only.  #6 leukocytosis: Most likely related to patient's symptoms.  Continue to monitor.    Advance Care Planning:   Code Status: Full Code   Consults: None  Family Communication: Wife and daughter at bedside  Severity of Illness: The appropriate patient status for this patient is INPATIENT. Inpatient status is judged to be reasonable and necessary in order to provide the required intensity of service to ensure the patient's safety. The patient's presenting symptoms, physical exam findings, and initial radiographic and laboratory data in the context of their chronic comorbidities is felt to place them at high risk for further clinical deterioration. Furthermore, it is not anticipated that the patient will be medically stable for discharge from the hospital within 2 midnights of admission.   * I certify that at the point of admission it is my clinical judgment that the patient will require inpatient hospital care spanning beyond 2 midnights from the point of admission due to high intensity of service, high risk for further deterioration and high frequency of surveillance required.*  AuthorBETHA SIM KNOLL, MD 03/01/2023 6:48 PM  For on call review [email or web address].

## 2023-03-02 ENCOUNTER — Inpatient Hospital Stay (HOSPITAL_COMMUNITY): Payer: Commercial Managed Care - PPO

## 2023-03-02 DIAGNOSIS — N179 Acute kidney failure, unspecified: Secondary | ICD-10-CM | POA: Diagnosis not present

## 2023-03-02 LAB — RENAL FUNCTION PANEL
Albumin: 1.9 g/dL — ABNORMAL LOW (ref 3.5–5.0)
Anion gap: 8 (ref 5–15)
BUN: 39 mg/dL — ABNORMAL HIGH (ref 8–23)
CO2: 18 mmol/L — ABNORMAL LOW (ref 22–32)
Calcium: 8.1 mg/dL — ABNORMAL LOW (ref 8.9–10.3)
Chloride: 110 mmol/L (ref 98–111)
Creatinine, Ser: 3.27 mg/dL — ABNORMAL HIGH (ref 0.61–1.24)
GFR, Estimated: 21 mL/min — ABNORMAL LOW (ref >60)
Glucose, Bld: 132 mg/dL — ABNORMAL HIGH (ref 70–99)
Phosphorus: 3 mg/dL (ref 2.5–4.6)
Potassium: 4.1 mmol/L (ref 3.5–5.1)
Sodium: 136 mmol/L (ref 135–145)

## 2023-03-02 LAB — CBC
HCT: 27.9 % — ABNORMAL LOW (ref 39.0–52.0)
Hemoglobin: 9.4 g/dL — ABNORMAL LOW (ref 13.0–17.0)
MCH: 29.7 pg (ref 26.0–34.0)
MCHC: 33.7 g/dL (ref 30.0–36.0)
MCV: 88.3 fL (ref 80.0–100.0)
Platelets: 279 10*3/uL (ref 150–400)
RBC: 3.16 MIL/uL — ABNORMAL LOW (ref 4.22–5.81)
RDW: 13.1 % (ref 11.5–15.5)
WBC: 8.8 10*3/uL (ref 4.0–10.5)
nRBC: 0 % (ref 0.0–0.2)

## 2023-03-02 LAB — GLUCOSE, CAPILLARY
Glucose-Capillary: 158 mg/dL — ABNORMAL HIGH (ref 70–99)
Glucose-Capillary: 222 mg/dL — ABNORMAL HIGH (ref 70–99)
Glucose-Capillary: 93 mg/dL (ref 70–99)
Glucose-Capillary: 147 mg/dL — ABNORMAL HIGH (ref 70–99)

## 2023-03-02 LAB — HIV ANTIBODY (ROUTINE TESTING W REFLEX): HIV Screen 4th Generation wRfx: NONREACTIVE

## 2023-03-02 LAB — URINALYSIS, ROUTINE W REFLEX MICROSCOPIC
Bilirubin Urine: NEGATIVE
Glucose, UA: 50 mg/dL — AB
Hgb urine dipstick: NEGATIVE
Ketones, ur: 5 mg/dL — AB
Leukocytes,Ua: NEGATIVE
Nitrite: NEGATIVE
Protein, ur: 100 mg/dL — AB
Specific Gravity, Urine: 1.011 (ref 1.005–1.030)
pH: 6 (ref 5.0–8.0)

## 2023-03-02 LAB — HEMOGLOBIN A1C
Hgb A1c MFr Bld: 7.6 % — ABNORMAL HIGH (ref 4.8–5.6)
Mean Plasma Glucose: 171.42 mg/dL

## 2023-03-02 MED ORDER — HYDRALAZINE HCL 20 MG/ML IJ SOLN
10.0000 mg | Freq: Four times a day (QID) | INTRAMUSCULAR | Status: DC | PRN
  Administered 2023-03-02 – 2023-03-05 (×3): 10 mg via INTRAVENOUS
  Filled 2023-03-02 (×3): qty 1

## 2023-03-02 MED ORDER — LINEZOLID 600 MG/300ML IV SOLN
600.0000 mg | Freq: Two times a day (BID) | INTRAVENOUS | Status: DC
Start: 2023-03-02 — End: 2023-03-04
  Administered 2023-03-02 – 2023-03-03 (×4): 600 mg via INTRAVENOUS
  Filled 2023-03-02 (×5): qty 300

## 2023-03-02 MED ORDER — OXYMETAZOLINE HCL 0.05 % NA SOLN
1.0000 | Freq: Two times a day (BID) | NASAL | Status: AC
  Administered 2023-03-02 – 2023-03-04 (×6): 1 via NASAL
  Filled 2023-03-02: qty 15

## 2023-03-02 MED ORDER — HYDROCODONE-ACETAMINOPHEN 5-325 MG PO TABS
1.0000 | ORAL_TABLET | Freq: Four times a day (QID) | ORAL | Status: DC | PRN
  Administered 2023-03-02: 1 via ORAL
  Filled 2023-03-02: qty 1

## 2023-03-02 MED ORDER — FLUTICASONE PROPIONATE 50 MCG/ACT NA SUSP
1.0000 | Freq: Every day | NASAL | Status: DC
  Administered 2023-03-03 – 2023-03-06 (×4): 1 via NASAL
  Filled 2023-03-02: qty 16

## 2023-03-02 MED ORDER — AMLODIPINE BESYLATE 5 MG PO TABS
5.0000 mg | ORAL_TABLET | Freq: Every day | ORAL | Status: DC
  Administered 2023-03-02 – 2023-03-06 (×5): 5 mg via ORAL
  Filled 2023-03-02 (×5): qty 1

## 2023-03-02 MED ORDER — METRONIDAZOLE 500 MG/100ML IV SOLN
500.0000 mg | Freq: Two times a day (BID) | INTRAVENOUS | Status: DC
  Administered 2023-03-02: 500 mg via INTRAVENOUS
  Filled 2023-03-02: qty 100

## 2023-03-02 MED ORDER — SODIUM CHLORIDE 0.9 % IV SOLN: 2.0000 g | INTRAVENOUS | Status: DC

## 2023-03-02 MED ORDER — BUTALBITAL-APAP-CAFFEINE 50-325-40 MG PO TABS
1.0000 | ORAL_TABLET | Freq: Four times a day (QID) | ORAL | Status: DC | PRN
  Administered 2023-03-02 – 2023-03-05 (×5): 1 via ORAL
  Filled 2023-03-02 (×5): qty 1

## 2023-03-02 MED ORDER — OSELTAMIVIR PHOSPHATE 30 MG PO CAPS
30.0000 mg | ORAL_CAPSULE | Freq: Two times a day (BID) | ORAL | Status: DC
  Administered 2023-03-02: 30 mg via ORAL
  Filled 2023-03-02: qty 1

## 2023-03-02 MED ORDER — NEPRO/CARBSTEADY PO LIQD
237.0000 mL | Freq: Three times a day (TID) | ORAL | Status: DC
  Administered 2023-03-02 – 2023-03-06 (×11): 237 mL via ORAL
  Filled 2023-03-02 (×14): qty 237

## 2023-03-02 MED ORDER — PIPERACILLIN-TAZOBACTAM 3.375 G IVPB
3.3750 g | Freq: Three times a day (TID) | INTRAVENOUS | Status: DC
  Administered 2023-03-02 – 2023-03-04 (×5): 3.375 g via INTRAVENOUS
  Filled 2023-03-02 (×5): qty 50

## 2023-03-02 MED ORDER — OSELTAMIVIR PHOSPHATE 30 MG PO CAPS
30.0000 mg | ORAL_CAPSULE | Freq: Every day | ORAL | Status: AC
  Administered 2023-03-03 – 2023-03-06 (×4): 30 mg via ORAL
  Filled 2023-03-02 (×4): qty 1

## 2023-03-02 MED ORDER — SODIUM CHLORIDE 0.9 % IV SOLN: INTRAVENOUS | Status: DC

## 2023-03-02 NOTE — Progress Notes (Signed)
 PROGRESS NOTE    Joshua Galvan  FMW:[id number] DOB: 1960-02-04 DOA: 03/01/2023 PCP: System, Provider Not In   Brief Narrative: 63 year old with past medical history significant for essential hypertension, hyperlipidemia, diabetes type 2 recently diagnosed with influenza A has been feeling weak for the last week, reports poor oral intake.  Patient admitted with AKI, creatinine 3.3, previous creatinine available 2 years ago was 1.2.    Assessment & Plan:   Principal Problem:   AKI (acute kidney injury) (HCC) Active Problems:   Chronic kidney disease, stage 3a (HCC)   Diabetes mellitus type 2 with complications (HCC)   Essential hypertension   Hyperlipidemia  1-AKI: -In the setting of hypovolemia, poor oral intake, diuretics ARB -Present with a creatinine of 3.3.  Previous creatinine 2 years ago 1.2 -Strict I's and O's -Renal US : Increased echogenicity of renal parenchyma is noted bilaterally suggesting medical renal disease. No hydronephrosis or renal obstruction is noted. -Continue with IV fluids.   Influenza A: -Symptoms started 4 days ago, now he is requiring hospitalization will start Tamiflu .    Severe headache: -suspect related to sinusitis.  -will proceed with MRI -Resume Fioricet .   Diabetes type II -Does not appear to be of medication as an outpatient -A1c 7.6 -Will order sliding scale insulin   Hypertension: -Continue to hold losartan and hydrochlorothiazide. Start Norvas.  PRN Hydralazine    Leukocytosis: Otomastoiditis, maxillary sinusitis sphenoid sinusitis, chronic left maxillary sinusitis -plan to start IV linezild, and Zosyn .  -plan to check Orbital MRI and brain MRI due to severe Headaches.       Estimated body mass index is 24.41 kg/m as calculated from the following:   Height as of 02/26/23: 5' 11 (1.803 m).   Weight as of 02/26/23: 79.4 kg.   DVT prophylaxis: Heparin   Code Status: Full code Family Communication: Wife at  bedside Disposition Plan:  Status is: Inpatient Remains inpatient appropriate because: management of AKI    Consultants:  none  Procedures:  none  Antimicrobials:    Subjective: He is alert, no worsening cough or dyspnea. Feels tired weak.  Headache on the left side of the head and high is what is more bothersome  Objective: Vitals:   03/01/23 2236 03/02/23 0012 03/02/23 0416 03/02/23 0743  BP: 127/72 122/81 136/77 (!) 169/93  Pulse: 83 91 85 86  Resp:  20 20 14   Temp:  98.8 F (37.1 C) 98.4 F (36.9 C) 98 F (36.7 C)  TempSrc:    Oral  SpO2:  99% 98% 99%   No intake or output data in the 24 hours ending 03/02/23 0745 There were no vitals filed for this visit.  Examination:  General exam: Appears calm and comfortable  Respiratory system: Clear to auscultation. Respiratory effort normal. Cardiovascular system: S1 & S2 heard, RRR. No JVD, murmurs, rubs, gallops or clicks. No pedal edema. Gastrointestinal system: Abdomen is nondistended, soft and nontender. No organomegaly or masses felt. Normal bowel sounds heard. Central nervous system: Alert and oriented. No focal neurological deficits. Extremities: Symmetric 5 x 5 power. Skin: No rashes, lesions or ulcers   Data Reviewed: I have personally reviewed following labs and imaging studies  CBC: Recent Labs  Lab 03/01/23 1303 03/02/23 0423  WBC 12.8* 8.8  NEUTROABS 9.7*  --   HGB 10.8* 9.4*  HCT 32.0* 27.9*  MCV 87.9 88.3  PLT 306 279   Basic Metabolic Panel: Recent Labs  Lab 03/01/23 1303 03/02/23 0423  NA 136 136  K 4.2 4.1  CL  107 110  CO2 19* 18*  GLUCOSE 187* 132*  BUN 42* 39*  CREATININE 3.39* 3.27*  CALCIUM  8.6* 8.1*  PHOS  --  3.0   GFR: Estimated Creatinine Clearance: 25.3 mL/min (A) (by C-G formula based on SCr of 3.27 mg/dL (H)). Liver Function Tests: Recent Labs  Lab 03/01/23 1922 03/02/23 0423  AST 15  --   ALT 12  --   ALKPHOS 104  --   BILITOT 0.5  --   PROT 6.4*  --    ALBUMIN 2.4* 1.9*   No results for input(s): LIPASE, AMYLASE in the last 168 hours. No results for input(s): AMMONIA in the last 168 hours. Coagulation Profile: No results for input(s): INR, PROTIME in the last 168 hours. Cardiac Enzymes: No results for input(s): CKTOTAL, CKMB, CKMBINDEX, TROPONINI in the last 168 hours. BNP (last 3 results) No results for input(s): PROBNP in the last 8760 hours. HbA1C: Recent Labs    03/02/23 0423  HGBA1C 7.6*   CBG: Recent Labs  Lab 03/01/23 2140  GLUCAP 127*   Lipid Profile: No results for input(s): CHOL, HDL, LDLCALC, TRIG, CHOLHDL, LDLDIRECT in the last 72 hours. Thyroid Function Tests: No results for input(s): TSH, T4TOTAL, FREET4, T3FREE, THYROIDAB in the last 72 hours. Anemia Panel: No results for input(s): VITAMINB12, FOLATE, FERRITIN, TIBC, IRON, RETICCTPCT in the last 72 hours. Sepsis Labs: No results for input(s): PROCALCITON, LATICACIDVEN in the last 168 hours.  Recent Results (from the past 240 hours)  Resp panel by RT-PCR (RSV, Flu A&B, Covid) Anterior Nasal Swab     Status: Abnormal   Collection Time: 02/26/23 11:46 PM   Specimen: Anterior Nasal Swab  Result Value Ref Range Status   SARS Coronavirus 2 by RT PCR NEGATIVE NEGATIVE Final    Comment: (NOTE) SARS-CoV-2 target nucleic acids are NOT DETECTED.  The SARS-CoV-2 RNA is generally detectable in upper respiratory specimens during the acute phase of infection. The lowest concentration of SARS-CoV-2 viral copies this assay can detect is 138 copies/mL. A negative result does not preclude SARS-Cov-2 infection and should not be used as the sole basis for treatment or other patient management decisions. A negative result may occur with  improper specimen collection/handling, submission of specimen other than nasopharyngeal swab, presence of viral mutation(s) within the areas targeted by this assay, and inadequate  number of viral copies(<138 copies/mL). A negative result must be combined with clinical observations, patient history, and epidemiological information. The expected result is Negative.  Fact Sheet for Patients:  bloggercourse.com  Fact Sheet for Healthcare Providers:  seriousbroker.it  This test is no t yet approved or cleared by the [place]  FDA and  has been authorized for detection and/or diagnosis of SARS-CoV-2 by FDA under an Emergency Use Authorization (EUA). This EUA will remain  in effect (meaning this test can be used) for the duration of the COVID-19 declaration under Section 564(b)(1) of the Act, 21 U.S.C.section 360bbb-3(b)(1), unless the authorization is terminated  or revoked sooner.       Influenza A by PCR POSITIVE (A) NEGATIVE Final   Influenza B by PCR NEGATIVE NEGATIVE Final    Comment: (NOTE) The Xpert Xpress SARS-CoV-2/FLU/RSV plus assay is intended as an aid in the diagnosis of influenza from Nasopharyngeal swab specimens and should not be used as a sole basis for treatment. Nasal washings and aspirates are unacceptable for Xpert Xpress SARS-CoV-2/FLU/RSV testing.  Fact Sheet for Patients: bloggercourse.com  Fact Sheet for Healthcare Providers: seriousbroker.it  This test is not  yet approved or cleared by the [place]  FDA and has been authorized for detection and/or diagnosis of SARS-CoV-2 by FDA under an Emergency Use Authorization (EUA). This EUA will remain in effect (meaning this test can be used) for the duration of the COVID-19 declaration under Section 564(b)(1) of the Act, 21 U.S.C. section 360bbb-3(b)(1), unless the authorization is terminated or revoked.     Resp Syncytial Virus by PCR NEGATIVE NEGATIVE Final    Comment: (NOTE) Fact Sheet for Patients: bloggercourse.com  Fact Sheet for Healthcare  Providers: seriousbroker.it  This test is not yet approved or cleared by the [place]  FDA and has been authorized for detection and/or diagnosis of SARS-CoV-2 by FDA under an Emergency Use Authorization (EUA). This EUA will remain in effect (meaning this test can be used) for the duration of the COVID-19 declaration under Section 564(b)(1) of the Act, 21 U.S.C. section 360bbb-3(b)(1), unless the authorization is terminated or revoked.  Performed at [facility], [address] [place], [place] [address]   Group A Strep by PCR [facility] Only)     Status: None   Collection Time: 02/26/23 11:46 PM   Specimen: Throat; Sterile Swab  Result Value Ref Range Status   Group A Strep by PCR NOT DETECTED NOT DETECTED Final    Comment: Performed at [facility], [address] [place], [place] [address]         Radiology Studies: US  Renal Result Date: 03/01/2023 CLINICAL DATA:  Acute kidney injury. EXAM: RENAL / URINARY TRACT ULTRASOUND COMPLETE COMPARISON:  None Available. FINDINGS: Right Kidney: Renal measurements: 10.4 x 6.2 x 4.9 cm = volume: 165 mL. Mildly increased echogenicity of renal parenchyma is noted. 1.8 cm simple cyst is noted. No mass or hydronephrosis visualized. Left Kidney: Renal measurements: 9.7 x 5.3 x 4.0 cm = volume: 108 mL. Increased echogenicity of renal parenchyma is noted. No mass or hydronephrosis visualized. Bladder: Appears normal for degree of bladder distention. Other: None. IMPRESSION: Increased echogenicity of renal parenchyma is noted bilaterally suggesting medical renal disease. No hydronephrosis or renal obstruction is noted. Electronically Signed   By: Lynwood Landy Raddle M.D.   On: 03/01/2023 19:22   CT Head Wo Contrast Result Date: 03/01/2023 CLINICAL DATA:  Hypertension, headache EXAM: CT HEAD WITHOUT CONTRAST TECHNIQUE: Contiguous axial images were obtained from the base of the skull through the vertex without  intravenous contrast. RADIATION DOSE REDUCTION: This exam was performed according to the departmental dose-optimization program which includes automated exposure control, adjustment of the mA and/or kV according to patient size and/or use of iterative reconstruction technique. COMPARISON:  None Available. FINDINGS: Brain: No evidence of acute infarction, hemorrhage, mass, mass effect, or midline shift. No hydrocephalus or extra-axial fluid collection. Vascular: No hyperdense vessel. Skull: Negative for fracture or focal lesion. Sinuses/Orbits: Near complete opacification of the left maxillary sinus, with osseous thickening of the left maxillary sinus walls. Opacification of the left greater than right ethmoid air cells and left greater than right sphenoid sinus, with air-fluid levels in the left sphenoid sinus, and right maxillary sinus. No acute finding in the orbits. Other: Fluid in the left mastoid air cells and middle ear. Trace fluid in the right mastoid air cells. IMPRESSION: 1. No acute intracranial process. 2. Fluid in the left mastoid air cells and middle ear, which could represent otomastoiditis. 3. Paranasal sinus disease, with air-fluid levels in the left sphenoid sinus and right maxillary sinus, which can be seen in the setting of acute sinusitis. Osseous  thickening of the left maxillary sinus suggests underlying chronic left maxillary sinusitis. Electronically Signed   By: Donald Campion M.D.   On: 03/01/2023 13:58        Scheduled Meds:  acetaminophen        heparin   5,000 Units Subcutaneous Q8H   insulin  aspart  0-15 Units Subcutaneous TID WC   insulin  aspart  0-5 Units Subcutaneous QHS   Continuous Infusions:  sodium chloride  125 mL/hr at 03/02/23 0549   cefTRIAXone  (ROCEPHIN )  IV 1 g (03/02/23 0139)     LOS: 1 day    Time spent: 35 minutes    Winfield Caba A Kani Chauvin, MD [facility]   If 7PM-7AM, please contact night-coverage [email or web address]  03/02/2023, 7:45 AM

## 2023-03-02 NOTE — Progress Notes (Signed)
   03/02/23 1548  TOC Brief Assessment  Insurance and Status Reviewed  Patient has primary care physician Yes  Home environment has been reviewed Yes  Prior level of function: Independent  Prior/Current Home Services No current home services  Social Drivers of Health Review SDOH reviewed no interventions necessary  Readmission risk has been reviewed Yes  Transition of care needs no transition of care needs at this time

## 2023-03-02 NOTE — Plan of Care (Signed)
 Problem: Education: Goal: Knowledge of General Education information will improve Description: Including pain rating scale, medication(s)/side effects and non-pharmacologic comfort measures 03/02/2023 1819 by Kingston Gin, RN Outcome: Progressing 03/02/2023 1737 by Kingston Gin, RN Outcome: Progressing   Problem: Health Behavior/Discharge Planning: Goal: Ability to manage health-related needs will improve 03/02/2023 1819 by Kingston Gin, RN Outcome: Progressing 03/02/2023 1737 by Kingston Gin, RN Outcome: Progressing   Problem: Clinical Measurements: Goal: Ability to maintain clinical measurements within normal limits will improve 03/02/2023 1819 by Kingston Gin, RN Outcome: Progressing 03/02/2023 1737 by Kingston Gin, RN Outcome: Progressing Goal: Will remain free from infection 03/02/2023 1819 by Kingston Gin, RN Outcome: Progressing 03/02/2023 1737 by Kingston Gin, RN Outcome: Progressing Goal: Diagnostic test results will improve 03/02/2023 1819 by Kingston Gin, RN Outcome: Progressing 03/02/2023 1737 by Kingston Gin, RN Outcome: Progressing Goal: Respiratory complications will improve 03/02/2023 1819 by Kingston Gin, RN Outcome: Progressing 03/02/2023 1737 by Kingston Gin, RN Outcome: Progressing Goal: Cardiovascular complication will be avoided 03/02/2023 1819 by Kingston Gin, RN Outcome: Progressing 03/02/2023 1737 by Kingston Gin, RN Outcome: Progressing   Problem: Activity: Goal: Risk for activity intolerance will decrease 03/02/2023 1819 by Kingston Gin, RN Outcome: Progressing 03/02/2023 1737 by Kingston Gin, RN Outcome: Progressing   Problem: Nutrition: Goal: Adequate nutrition will be maintained 03/02/2023 1819 by Kingston Gin, RN Outcome: Progressing 03/02/2023 1737 by Kingston Gin, RN Outcome: Progressing   Problem: Coping: Goal: Level of anxiety will decrease 03/02/2023 1819 by Kingston Gin, RN Outcome:  Progressing 03/02/2023 1737 by Kingston Gin, RN Outcome: Progressing   Problem: Elimination: Goal: Will not experience complications related to bowel motility 03/02/2023 1819 by Kingston Gin, RN Outcome: Progressing 03/02/2023 1737 by Kingston Gin, RN Outcome: Progressing Goal: Will not experience complications related to urinary retention 03/02/2023 1819 by Kingston Gin, RN Outcome: Progressing 03/02/2023 1737 by Kingston Gin, RN Outcome: Progressing   Problem: Pain Managment: Goal: General experience of comfort will improve and/or be controlled 03/02/2023 1819 by Kingston Gin, RN Outcome: Progressing 03/02/2023 1737 by Kingston Gin, RN Outcome: Progressing   Problem: Safety: Goal: Ability to remain free from injury will improve 03/02/2023 1819 by Kingston Gin, RN Outcome: Progressing 03/02/2023 1737 by Kingston Gin, RN Outcome: Progressing   Problem: Skin Integrity: Goal: Risk for impaired skin integrity will decrease 03/02/2023 1819 by Kingston Gin, RN Outcome: Progressing 03/02/2023 1737 by Kingston Gin, RN Outcome: Progressing   Problem: Education: Goal: Ability to describe self-care measures that may prevent or decrease complications (Diabetes Survival Skills Education) will improve 03/02/2023 1819 by Kingston Gin, RN Outcome: Progressing 03/02/2023 1737 by Kingston Gin, RN Outcome: Progressing Goal: Individualized Educational Video(s) 03/02/2023 1819 by Kingston Gin, RN Outcome: Progressing 03/02/2023 1737 by Kingston Gin, RN Outcome: Progressing   Problem: Coping: Goal: Ability to adjust to condition or change in health will improve 03/02/2023 1819 by Kingston Gin, RN Outcome: Progressing 03/02/2023 1737 by Kingston Gin, RN Outcome: Progressing   Problem: Fluid Volume: Goal: Ability to maintain a balanced intake and output will improve 03/02/2023 1819 by Kingston Gin, RN Outcome: Progressing 03/02/2023 1737 by  Kingston Gin, RN Outcome: Progressing   Problem: Health Behavior/Discharge Planning: Goal: Ability to identify and utilize available resources and services will improve 03/02/2023 1819 by Kingston Gin, RN Outcome: Progressing 03/02/2023 1737 by Kingston Gin, RN Outcome: Progressing Goal: Ability to manage health-related needs will improve 03/02/2023 1819 by Kingston Gin, RN Outcome: Progressing 03/02/2023 1737 by Kingston Gin, RN Outcome: Progressing   Problem: Metabolic: Goal: Ability to maintain appropriate glucose  levels will improve 03/02/2023 1819 by Kingston Gin, RN Outcome: Progressing 03/02/2023 1737 by Kingston Gin, RN Outcome: Progressing   Problem: Nutritional: Goal: Maintenance of adequate nutrition will improve 03/02/2023 1819 by Kingston Gin, RN Outcome: Progressing 03/02/2023 1737 by Kingston Gin, RN Outcome: Progressing Goal: Progress toward achieving an optimal weight will improve 03/02/2023 1819 by Kingston Gin, RN Outcome: Progressing 03/02/2023 1737 by Kingston Gin, RN Outcome: Progressing   Problem: Skin Integrity: Goal: Risk for impaired skin integrity will decrease 03/02/2023 1819 by Kingston Gin, RN Outcome: Progressing 03/02/2023 1737 by Kingston Gin, RN Outcome: Progressing   Problem: Tissue Perfusion: Goal: Adequacy of tissue perfusion will improve 03/02/2023 1819 by Kingston Gin, RN Outcome: Progressing 03/02/2023 1737 by Kingston Gin, RN Outcome: Progressing

## 2023-03-02 NOTE — Progress Notes (Signed)
 Pharmacy Antibiotic Note  Joshua Galvan is a 63 y.o. male admitted on 03/01/2023 with Otomastoiditis.  Pharmacy has been consulted for zosyn  dosing.  Plan:  Zosyn  3.375 gm IV q8hr, infuse each dose over 4 hours Zyvox  600 mg IV q12 per MD Tamiflu  30 po daily for CrCl < 30 x 5 days F/u renal function, culture data  Temp (24hrs), Avg:98.4 F (36.9 C), Min:97.9 F (36.6 C), Max:98.8 F (37.1 C)  Recent Labs  Lab 03/01/23 1303 03/02/23 0423  WBC 12.8* 8.8  CREATININE 3.39* 3.27*    Estimated Creatinine Clearance: 25.3 mL/min (A) (by C-G formula based on SCr of 3.27 mg/dL (H)).    No Known Allergies  Antimicrobials this admission: 2/7 CTX x 1 2/7 Cefepime>> 2/7 zyvox >> 2/7 flagyl  >> 2/7 tamiflu >>  Dose adjustments this admission:   Microbiology results: 2/3 Flu A +  Thank you for allowing pharmacy to be a part of this patient's care.  Rosaline IVAR Edison, Pharm.D Use secure chat for questions 03/02/2023 9:55 AM

## 2023-03-02 NOTE — Plan of Care (Signed)

## 2023-03-03 DIAGNOSIS — N179 Acute kidney failure, unspecified: Secondary | ICD-10-CM | POA: Diagnosis not present

## 2023-03-03 LAB — CBC
HCT: 26.6 % — ABNORMAL LOW (ref 39.0–52.0)
Hemoglobin: 8.9 g/dL — ABNORMAL LOW (ref 13.0–17.0)
MCH: 29.5 pg (ref 26.0–34.0)
MCHC: 33.5 g/dL (ref 30.0–36.0)
MCV: 88.1 fL (ref 80.0–100.0)
Platelets: 292 10*3/uL (ref 150–400)
RBC: 3.02 MIL/uL — ABNORMAL LOW (ref 4.22–5.81)
RDW: 13.4 % (ref 11.5–15.5)
WBC: 7.4 10*3/uL (ref 4.0–10.5)
nRBC: 0 % (ref 0.0–0.2)

## 2023-03-03 LAB — BASIC METABOLIC PANEL
Anion gap: 8 (ref 5–15)
BUN: 39 mg/dL — ABNORMAL HIGH (ref 8–23)
CO2: 18 mmol/L — ABNORMAL LOW (ref 22–32)
Calcium: 7.6 mg/dL — ABNORMAL LOW (ref 8.9–10.3)
Chloride: 106 mmol/L (ref 98–111)
Creatinine, Ser: 3.49 mg/dL — ABNORMAL HIGH (ref 0.61–1.24)
GFR, Estimated: 19 mL/min — ABNORMAL LOW (ref >60)
Glucose, Bld: 361 mg/dL — ABNORMAL HIGH (ref 70–99)
Potassium: 3.9 mmol/L (ref 3.5–5.1)
Sodium: 132 mmol/L — ABNORMAL LOW (ref 135–145)

## 2023-03-03 LAB — GLUCOSE, CAPILLARY
Glucose-Capillary: 307 mg/dL — ABNORMAL HIGH (ref 70–99)
Glucose-Capillary: 87 mg/dL (ref 70–99)
Glucose-Capillary: 100 mg/dL — ABNORMAL HIGH (ref 70–99)
Glucose-Capillary: 157 mg/dL — ABNORMAL HIGH (ref 70–99)

## 2023-03-03 MED ORDER — LACTATED RINGERS IV BOLUS
500.0000 mL | Freq: Once | INTRAVENOUS | Status: AC
  Administered 2023-03-03: 500 mL via INTRAVENOUS

## 2023-03-03 MED ORDER — INSULIN GLARGINE-YFGN 100 UNIT/ML ~~LOC~~ SOLN
10.0000 [IU] | Freq: Every day | SUBCUTANEOUS | Status: DC
  Administered 2023-03-03 – 2023-03-06 (×4): 10 [IU] via SUBCUTANEOUS
  Filled 2023-03-03 (×4): qty 0.1

## 2023-03-03 NOTE — Plan of Care (Signed)
  Problem: Education: Goal: Knowledge of General Education information will improve Description: Including pain rating scale, medication(s)/side effects and non-pharmacologic comfort measures Outcome: Progressing   Problem: Health Behavior/Discharge Planning: Goal: Ability to manage health-related needs will improve Outcome: Progressing   Problem: Clinical Measurements: Goal: Ability to maintain clinical measurements within normal limits will improve Outcome: Progressing Goal: Will remain free from infection Outcome: Progressing Goal: Diagnostic test results will improve Outcome: Progressing Goal: Respiratory complications will improve Outcome: Progressing Goal: Cardiovascular complication will be avoided Outcome: Progressing   Problem: Activity: Goal: Risk for activity intolerance will decrease Outcome: Progressing   Problem: Nutrition: Goal: Adequate nutrition will be maintained Outcome: Progressing   Problem: Coping: Goal: Level of anxiety will decrease Outcome: Progressing   Problem: Elimination: Goal: Will not experience complications related to bowel motility Outcome: Progressing Goal: Will not experience complications related to urinary retention Outcome: Progressing   Problem: Pain Managment: Goal: General experience of comfort will improve and/or be controlled Outcome: Progressing   Problem: Safety: Goal: Ability to remain free from injury will improve Outcome: Progressing   Problem: Skin Integrity: Goal: Risk for impaired skin integrity will decrease Outcome: Progressing   Problem: Education: Goal: Ability to describe self-care measures that may prevent or decrease complications (Diabetes Survival Skills Education) will improve Outcome: Progressing Goal: Individualized Educational Video(s) Outcome: Progressing   Problem: Coping: Goal: Ability to adjust to condition or change in health will improve Outcome: Progressing   Problem: Fluid  Volume: Goal: Ability to maintain a balanced intake and output will improve Outcome: Progressing   Problem: Health Behavior/Discharge Planning: Goal: Ability to identify and utilize available resources and services will improve Outcome: Progressing Goal: Ability to manage health-related needs will improve Outcome: Progressing   Problem: Metabolic: Goal: Ability to maintain appropriate glucose levels will improve Outcome: Progressing   Problem: Nutritional: Goal: Maintenance of adequate nutrition will improve Outcome: Progressing Goal: Progress toward achieving an optimal weight will improve Outcome: Progressing

## 2023-03-03 NOTE — Progress Notes (Signed)
 PROGRESS NOTE    Joshua Galvan  FMW:[id number] DOB: Jul 18, 1960 DOA: 03/01/2023 PCP: System, Provider Not In   Brief Narrative: 63 year old with past medical history significant for essential hypertension, hyperlipidemia, diabetes type 2 recently diagnosed with influenza A has been feeling weak for the last week, reports poor oral intake.  Patient admitted with AKI, creatinine 3.3, previous creatinine available 2 years ago was 1.2.    Assessment & Plan:   Principal Problem:   AKI (acute kidney injury) (HCC) Active Problems:   Chronic kidney disease, stage 3a (HCC)   Diabetes mellitus type 2 with complications (HCC)   Essential hypertension   Hyperlipidemia  1-AKI: -In the setting of hypovolemia, poor oral intake, diuretics ARB -Present with a creatinine of 3.3.  Previous creatinine 2 years ago 1.2 -Strict I's and O's -Renal US : Increased echogenicity of renal parenchyma is noted bilaterally suggesting medical renal disease. No hydronephrosis or renal obstruction is noted. -Continue with IV fluids.  If no improvement by tomorrow will consult nephrology   Influenza A: -Symptoms started 4 days ago, now he is requiring hospitalization will start Tamiflu .  Improving.   Severe headache: -suspect related to sinusitis.  -MRI brain, Orbit negative -Resume Fioricet .  Headaches improved.   Diabetes type II -Does not appear to be of medication as an outpatient -A1c 7.6 -Will order sliding scale insulin  Start long acting CBG 300\  Hypertension: -Continue to hold losartan and hydrochlorothiazide. Started  Norvas.  PRN Hydralazine    Leukocytosis: Otomastoiditis, maxillary sinusitis sphenoid sinusitis, chronic left maxillary sinusitis -Continue with  IV linezild, and Zosyn .  -Orbital MRI and brain MRI due to severe Headaches. Negative       Estimated body mass index is 25.06 kg/m as calculated from the following:   Height as of 02/26/23: 5' 11 (1.803 m).    Weight as of this encounter: 81.5 kg.   DVT prophylaxis: Heparin   Code Status: Full code Family Communication: Wife at bedside Disposition Plan:  Status is: Inpatient Remains inpatient appropriate because: management of AKI    Consultants:  none  Procedures:  none  Antimicrobials:    Subjective: He report headaches is better. Feels tired, feeling better.   Objective: Vitals:   03/02/23 1926 03/02/23 2221 03/03/23 0448 03/03/23 1139  BP: (!) 165/87 117/72 131/77 (!) 142/78  Pulse: 90 89 91 86  Resp: 18  17 16   Temp: 97.8 F (36.6 C)  98 F (36.7 C) 97.7 F (36.5 C)  TempSrc: Oral     SpO2: 100%  99% 100%  Weight:   81.5 kg     Intake/Output Summary (Last 24 hours) at 03/03/2023 1336 Last data filed at 03/03/2023 0900 Gross per 24 hour  Intake 652.55 ml  Output 1250 ml  Net -597.45 ml   Filed Weights   03/02/23 0707 03/03/23 0448  Weight: 81.7 kg 81.5 kg    Examination:  General exam: NAD Respiratory system: CTA Cardiovascular system: S 1, S 2 RRR Gastrointestinal system: BS present. Soft . nt Extremities: no edema    Data Reviewed: I have personally reviewed following labs and imaging studies  CBC: Recent Labs  Lab 03/01/23 1303 03/02/23 0423 03/03/23 0503  WBC 12.8* 8.8 7.4  NEUTROABS 9.7*  --   --   HGB 10.8* 9.4* 8.9*  HCT 32.0* 27.9* 26.6*  MCV 87.9 88.3 88.1  PLT 306 279 292   Basic Metabolic Panel: Recent Labs  Lab 03/01/23 1303 03/02/23 0423 03/03/23 0503  NA 136 136 132*  K  4.2 4.1 3.9  CL 107 110 106  CO2 19* 18* 18*  GLUCOSE 187* 132* 361*  BUN 42* 39* 39*  CREATININE 3.39* 3.27* 3.49*  CALCIUM  8.6* 8.1* 7.6*  PHOS  --  3.0  --    GFR: Estimated Creatinine Clearance: 23.7 mL/min (A) (by C-G formula based on SCr of 3.49 mg/dL (H)). Liver Function Tests: Recent Labs  Lab 03/01/23 1922 03/02/23 0423  AST 15  --   ALT 12  --   ALKPHOS 104  --   BILITOT 0.5  --   PROT 6.4*  --   ALBUMIN 2.4* 1.9*   No results for  input(s): LIPASE, AMYLASE in the last 168 hours. No results for input(s): AMMONIA in the last 168 hours. Coagulation Profile: No results for input(s): INR, PROTIME in the last 168 hours. Cardiac Enzymes: No results for input(s): CKTOTAL, CKMB, CKMBINDEX, TROPONINI in the last 168 hours. BNP (last 3 results) No results for input(s): PROBNP in the last 8760 hours. HbA1C: Recent Labs    03/02/23 0423  HGBA1C 7.6*   CBG: Recent Labs  Lab 03/02/23 1125 03/02/23 1745 03/02/23 2051 03/03/23 0731 03/03/23 1140  GLUCAP 222* 93 147* 307* 87   Lipid Profile: No results for input(s): CHOL, HDL, LDLCALC, TRIG, CHOLHDL, LDLDIRECT in the last 72 hours. Thyroid Function Tests: No results for input(s): TSH, T4TOTAL, FREET4, T3FREE, THYROIDAB in the last 72 hours. Anemia Panel: No results for input(s): VITAMINB12, FOLATE, FERRITIN, TIBC, IRON, RETICCTPCT in the last 72 hours. Sepsis Labs: No results for input(s): PROCALCITON, LATICACIDVEN in the last 168 hours.  Recent Results (from the past 240 hours)  Resp panel by RT-PCR (RSV, Flu A&B, Covid) Anterior Nasal Swab     Status: Abnormal   Collection Time: 02/26/23 11:46 PM   Specimen: Anterior Nasal Swab  Result Value Ref Range Status   SARS Coronavirus 2 by RT PCR NEGATIVE NEGATIVE Final    Comment: (NOTE) SARS-CoV-2 target nucleic acids are NOT DETECTED.  The SARS-CoV-2 RNA is generally detectable in upper respiratory specimens during the acute phase of infection. The lowest concentration of SARS-CoV-2 viral copies this assay can detect is 138 copies/mL. A negative result does not preclude SARS-Cov-2 infection and should not be used as the sole basis for treatment or other patient management decisions. A negative result may occur with  improper specimen collection/handling, submission of specimen other than nasopharyngeal swab, presence of viral mutation(s) within the areas  targeted by this assay, and inadequate number of viral copies(<138 copies/mL). A negative result must be combined with clinical observations, patient history, and epidemiological information. The expected result is Negative.  Fact Sheet for Patients:  bloggercourse.com  Fact Sheet for Healthcare Providers:  seriousbroker.it  This test is no t yet approved or cleared by the [place]  FDA and  has been authorized for detection and/or diagnosis of SARS-CoV-2 by FDA under an Emergency Use Authorization (EUA). This EUA will remain  in effect (meaning this test can be used) for the duration of the COVID-19 declaration under Section 564(b)(1) of the Act, 21 U.S.C.section 360bbb-3(b)(1), unless the authorization is terminated  or revoked sooner.       Influenza A by PCR POSITIVE (A) NEGATIVE Final   Influenza B by PCR NEGATIVE NEGATIVE Final    Comment: (NOTE) The Xpert Xpress SARS-CoV-2/FLU/RSV plus assay is intended as an aid in the diagnosis of influenza from Nasopharyngeal swab specimens and should not be used as a sole basis for treatment. Nasal washings and  aspirates are unacceptable for Xpert Xpress SARS-CoV-2/FLU/RSV testing.  Fact Sheet for Patients: bloggercourse.com  Fact Sheet for Healthcare Providers: seriousbroker.it  This test is not yet approved or cleared by the [place]  FDA and has been authorized for detection and/or diagnosis of SARS-CoV-2 by FDA under an Emergency Use Authorization (EUA). This EUA will remain in effect (meaning this test can be used) for the duration of the COVID-19 declaration under Section 564(b)(1) of the Act, 21 U.S.C. section 360bbb-3(b)(1), unless the authorization is terminated or revoked.     Resp Syncytial Virus by PCR NEGATIVE NEGATIVE Final    Comment: (NOTE) Fact Sheet for  Patients: bloggercourse.com  Fact Sheet for Healthcare Providers: seriousbroker.it  This test is not yet approved or cleared by the [place]  FDA and has been authorized for detection and/or diagnosis of SARS-CoV-2 by FDA under an Emergency Use Authorization (EUA). This EUA will remain in effect (meaning this test can be used) for the duration of the COVID-19 declaration under Section 564(b)(1) of the Act, 21 U.S.C. section 360bbb-3(b)(1), unless the authorization is terminated or revoked.  Performed at [facility], [address] [place], [place] [address]   Group A Strep by PCR [facility] Only)     Status: None   Collection Time: 02/26/23 11:46 PM   Specimen: Throat; Sterile Swab  Result Value Ref Range Status   Group A Strep by PCR NOT DETECTED NOT DETECTED Final    Comment: Performed at [facility], [address], [place], [place] [address]         Radiology Studies: MR BRAIN WO CONTRAST Result Date: 03/02/2023 CLINICAL DATA:  Uveitis or scleritis suspected, headache EXAM: MRI HEAD AND ORBITS WITHOUT CONTRAST TECHNIQUE: Multiplanar, multi-echo pulse sequences of the brain and surrounding structures were acquired without intravenous contrast. Multiplanar, multi-echo pulse sequences of the orbits and surrounding structures were acquired including fat saturation techniques, without intravenous contrast administration. COMPARISON:  No prior MRI available, correlation is made with CT head 03/01/2023 FINDINGS: MRI HEAD FINDINGS Brain: No restricted diffusion to suggest acute or subacute infarct. No acute hemorrhage, mass, mass effect, or midline shift. No hydrocephalus or extra-axial collection. Scattered T2 hyperintense signal in the periventricular white matter, likely the sequela of chronic small vessel ischemic disease. Pituitary and craniocervical junction are within normal limits. Punctate foci of hemosiderin  deposition in the periventricular white matter, likely sequela of prior chronic hypertensive microhemorrhages. No evidence remote cortical or significant lacunar infarct. Vascular: Normal arterial flow voids. Skull and upper cervical spine: Normal marrow signal. Other: Fluid in the bilateral mastoid air cells and left middle ear. MRI ORBITS FINDINGS Evaluation is somewhat limited by the absence of intravenous contrast. Orbits: No orbital mass or evidence of inflammation. Normal appearance of the globes, optic nerve-sheath complexes, extraocular muscles, orbital fat and lacrimal glands. Visualized sinuses: Mucosal thickening in the left greater than right maxillary sinus, sphenoid sinus, and ethmoid air cells. Soft tissues: Normal. IMPRESSION: 1. No acute intracranial process. No evidence of acute or subacute infarct. 2. No acute process in the orbits. 3. Fluid in the left mastoid air cells and middle ear, which is nonspecific but can be seen in the setting of otomastoiditis. 4. Left-greater-than-right paranasal sinus disease, as can be seen in the setting of acute sinusitis. Correlate with symptoms. Electronically Signed   By: Donald Campion M.D.   On: 03/02/2023 17:49   MR ORBITS WO CONTRAST Result Date: 03/02/2023 CLINICAL DATA:  Uveitis or scleritis suspected, headache EXAM: MRI HEAD  AND ORBITS WITHOUT CONTRAST TECHNIQUE: Multiplanar, multi-echo pulse sequences of the brain and surrounding structures were acquired without intravenous contrast. Multiplanar, multi-echo pulse sequences of the orbits and surrounding structures were acquired including fat saturation techniques, without intravenous contrast administration. COMPARISON:  No prior MRI available, correlation is made with CT head 03/01/2023 FINDINGS: MRI HEAD FINDINGS Brain: No restricted diffusion to suggest acute or subacute infarct. No acute hemorrhage, mass, mass effect, or midline shift. No hydrocephalus or extra-axial collection. Scattered T2  hyperintense signal in the periventricular white matter, likely the sequela of chronic small vessel ischemic disease. Pituitary and craniocervical junction are within normal limits. Punctate foci of hemosiderin deposition in the periventricular white matter, likely sequela of prior chronic hypertensive microhemorrhages. No evidence remote cortical or significant lacunar infarct. Vascular: Normal arterial flow voids. Skull and upper cervical spine: Normal marrow signal. Other: Fluid in the bilateral mastoid air cells and left middle ear. MRI ORBITS FINDINGS Evaluation is somewhat limited by the absence of intravenous contrast. Orbits: No orbital mass or evidence of inflammation. Normal appearance of the globes, optic nerve-sheath complexes, extraocular muscles, orbital fat and lacrimal glands. Visualized sinuses: Mucosal thickening in the left greater than right maxillary sinus, sphenoid sinus, and ethmoid air cells. Soft tissues: Normal. IMPRESSION: 1. No acute intracranial process. No evidence of acute or subacute infarct. 2. No acute process in the orbits. 3. Fluid in the left mastoid air cells and middle ear, which is nonspecific but can be seen in the setting of otomastoiditis. 4. Left-greater-than-right paranasal sinus disease, as can be seen in the setting of acute sinusitis. Correlate with symptoms. Electronically Signed   By: Donald Campion M.D.   On: 03/02/2023 17:49   US  Renal Result Date: 03/01/2023 CLINICAL DATA:  Acute kidney injury. EXAM: RENAL / URINARY TRACT ULTRASOUND COMPLETE COMPARISON:  None Available. FINDINGS: Right Kidney: Renal measurements: 10.4 x 6.2 x 4.9 cm = volume: 165 mL. Mildly increased echogenicity of renal parenchyma is noted. 1.8 cm simple cyst is noted. No mass or hydronephrosis visualized. Left Kidney: Renal measurements: 9.7 x 5.3 x 4.0 cm = volume: 108 mL. Increased echogenicity of renal parenchyma is noted. No mass or hydronephrosis visualized. Bladder: Appears normal for  degree of bladder distention. Other: None. IMPRESSION: Increased echogenicity of renal parenchyma is noted bilaterally suggesting medical renal disease. No hydronephrosis or renal obstruction is noted. Electronically Signed   By: Lynwood Landy Raddle M.D.   On: 03/01/2023 19:22   CT Head Wo Contrast Result Date: 03/01/2023 CLINICAL DATA:  Hypertension, headache EXAM: CT HEAD WITHOUT CONTRAST TECHNIQUE: Contiguous axial images were obtained from the base of the skull through the vertex without intravenous contrast. RADIATION DOSE REDUCTION: This exam was performed according to the departmental dose-optimization program which includes automated exposure control, adjustment of the mA and/or kV according to patient size and/or use of iterative reconstruction technique. COMPARISON:  None Available. FINDINGS: Brain: No evidence of acute infarction, hemorrhage, mass, mass effect, or midline shift. No hydrocephalus or extra-axial fluid collection. Vascular: No hyperdense vessel. Skull: Negative for fracture or focal lesion. Sinuses/Orbits: Near complete opacification of the left maxillary sinus, with osseous thickening of the left maxillary sinus walls. Opacification of the left greater than right ethmoid air cells and left greater than right sphenoid sinus, with air-fluid levels in the left sphenoid sinus, and right maxillary sinus. No acute finding in the orbits. Other: Fluid in the left mastoid air cells and middle ear. Trace fluid in the right mastoid air cells. IMPRESSION: 1.  No acute intracranial process. 2. Fluid in the left mastoid air cells and middle ear, which could represent otomastoiditis. 3. Paranasal sinus disease, with air-fluid levels in the left sphenoid sinus and right maxillary sinus, which can be seen in the setting of acute sinusitis. Osseous thickening of the left maxillary sinus suggests underlying chronic left maxillary sinusitis. Electronically Signed   By: Donald Campion M.D.   On: 03/01/2023 13:58         Scheduled Meds:  amLODipine   5 mg Oral Daily   feeding supplement (NEPRO CARB STEADY)  237 mL Oral TID BM   fluticasone   1 spray Each Nare Daily   heparin   5,000 Units Subcutaneous Q8H   insulin  aspart  0-15 Units Subcutaneous TID WC   insulin  aspart  0-5 Units Subcutaneous QHS   insulin  glargine-yfgn  10 Units Subcutaneous Daily   oseltamivir   30 mg Oral Daily   oxymetazoline   1 spray Each Nare BID   Continuous Infusions:  sodium chloride  100 mL/hr at 03/02/23 2012   linezolid  (ZYVOX ) IV 600 mg (03/03/23 0853)   piperacillin -tazobactam (ZOSYN )  IV 3.375 g (03/03/23 1236)     LOS: 2 days    Time spent: 35 minutes    Tobi Leinweber A Chamaine Stankus, MD [facility]   If 7PM-7AM, please contact night-coverage [email or web address]  03/03/2023, 1:36 PM

## 2023-03-03 NOTE — Plan of Care (Signed)

## 2023-03-04 DIAGNOSIS — N179 Acute kidney failure, unspecified: Secondary | ICD-10-CM | POA: Diagnosis not present

## 2023-03-04 LAB — BASIC METABOLIC PANEL
Anion gap: 8 (ref 5–15)
BUN: 31 mg/dL — ABNORMAL HIGH (ref 8–23)
CO2: 19 mmol/L — ABNORMAL LOW (ref 22–32)
Calcium: 8.2 mg/dL — ABNORMAL LOW (ref 8.9–10.3)
Chloride: 112 mmol/L — ABNORMAL HIGH (ref 98–111)
Creatinine, Ser: 3.47 mg/dL — ABNORMAL HIGH (ref 0.61–1.24)
GFR, Estimated: 19 mL/min — ABNORMAL LOW (ref >60)
Glucose, Bld: 134 mg/dL — ABNORMAL HIGH (ref 70–99)
Potassium: 3.8 mmol/L (ref 3.5–5.1)
Sodium: 139 mmol/L (ref 135–145)

## 2023-03-04 LAB — CBC
HCT: 28.3 % — ABNORMAL LOW (ref 39.0–52.0)
Hemoglobin: 9.6 g/dL — ABNORMAL LOW (ref 13.0–17.0)
MCH: 30.1 pg (ref 26.0–34.0)
MCHC: 33.9 g/dL (ref 30.0–36.0)
MCV: 88.7 fL (ref 80.0–100.0)
Platelets: 357 10*3/uL (ref 150–400)
RBC: 3.19 MIL/uL — ABNORMAL LOW (ref 4.22–5.81)
RDW: 13.3 % (ref 11.5–15.5)
WBC: 7.1 10*3/uL (ref 4.0–10.5)
nRBC: 0 % (ref 0.0–0.2)

## 2023-03-04 LAB — GLUCOSE, CAPILLARY: Glucose-Capillary: 188 mg/dL — ABNORMAL HIGH (ref 70–99)

## 2023-03-04 MED ORDER — LACTATED RINGERS IV BOLUS
1000.0000 mL | Freq: Once | INTRAVENOUS | Status: AC
  Administered 2023-03-04: 1000 mL via INTRAVENOUS

## 2023-03-04 MED ORDER — LEVOFLOXACIN 250 MG PO TABS
250.0000 mg | ORAL_TABLET | Freq: Every day | ORAL | Status: DC
  Administered 2023-03-05 – 2023-03-06 (×2): 250 mg via ORAL
  Filled 2023-03-04 (×2): qty 1

## 2023-03-04 MED ORDER — LINEZOLID 600 MG PO TABS
600.0000 mg | ORAL_TABLET | Freq: Two times a day (BID) | ORAL | Status: DC
  Administered 2023-03-04 – 2023-03-06 (×5): 600 mg via ORAL
  Filled 2023-03-04 (×5): qty 1

## 2023-03-04 MED ORDER — LEVOFLOXACIN 500 MG PO TABS
500.0000 mg | ORAL_TABLET | Freq: Once | ORAL | Status: AC
  Administered 2023-03-04: 500 mg via ORAL
  Filled 2023-03-04: qty 1

## 2023-03-04 NOTE — Plan of Care (Signed)

## 2023-03-04 NOTE — Progress Notes (Signed)
 PROGRESS NOTE    Joshua Galvan  FMW:[id number] DOB: 1960/09/03 DOA: 03/01/2023 PCP: System, Provider Not In   Brief Narrative: 63 year old with past medical history significant for essential hypertension, hyperlipidemia, diabetes type 2 recently diagnosed with influenza A has been feeling weak for the last week, reports poor oral intake.  Patient admitted with AKI, creatinine 3.3, previous creatinine available 2 years ago was 1.2.    Assessment & Plan:   Principal Problem:   AKI (acute kidney injury) (HCC) Active Problems:   Chronic kidney disease, stage 3a (HCC)   Diabetes mellitus type 2 with complications (HCC)   Essential hypertension   Hyperlipidemia  1-AKI: -In the setting of hypovolemia, poor oral intake, diuretics ARB -Present with a creatinine of 3.3.  Previous creatinine 2 years ago 1.2 -Strict I's and O's -Renal US : Increased echogenicity of renal parenchyma is noted bilaterally suggesting medical renal disease. No hydronephrosis or renal obstruction is noted. -Continue with IV fluids.  Discussed with nephrology, plan to give more IV fluids today.   Influenza A: -Symptoms started 4 days ago, now he is requiring hospitalization will start Tamiflu .  Improving.   Severe headache: -suspect related to sinusitis.  -MRI brain, Orbit negative -Resume Fioricet .  Headaches improved.   Diabetes type II -Does not appear to be of medication as an outpatient -A1c 7.6 -Will order sliding scale insulin  Started  long acting CBG 300\while in the hospital.   Hypertension: -Continue to hold losartan and hydrochlorothiazide. Started  Norvas.  PRN Hydralazine    Leukocytosis: Otomastoiditis, maxillary sinusitis sphenoid sinusitis, chronic left maxillary sinusitis -Continue with  IV linezild, and Zosyn .  -Orbital MRI and brain MRI due to severe Headaches. Negative Change ZOsyn  to Levaquin  to avoid affection of renal function from Zosyn        Estimated  body mass index is 25.18 kg/m as calculated from the following:   Height as of this encounter: 5' 11 (1.803 m).   Weight as of this encounter: 81.9 kg.   DVT prophylaxis: Heparin   Code Status: Full code Family Communication: Wife at bedside Disposition Plan:  Status is: Inpatient Remains inpatient appropriate because: management of AKI    Consultants:  none  Procedures:  none  Antimicrobials:    Subjective: He report headaches today is better. He did had an episode yesterday of severe headaches.   Objective: Vitals:   03/04/23 0441 03/04/23 0537 03/04/23 0956 03/04/23 1227  BP: (!) 170/85 125/62 (!) 144/68 (!) 130/58  Pulse: 79 84 95 94  Resp: 18  20 16   Temp: 98 F (36.7 C)  98.1 F (36.7 C)   TempSrc: Oral  Oral   SpO2: 100%  100% 100%  Weight: 81.9 kg     Height:   5' 11 (1.803 m)     Intake/Output Summary (Last 24 hours) at 03/04/2023 1429 Last data filed at 03/04/2023 0600 Gross per 24 hour  Intake 2170 ml  Output 1500 ml  Net 670 ml   Filed Weights   03/02/23 0707 03/03/23 0448 03/04/23 0441  Weight: 81.7 kg 81.5 kg 81.9 kg    Examination:  General exam: NAD Respiratory system: CTA Cardiovascular system: S 1, S 2  RRR Gastrointestinal system: BS present, soft, nt Extremities: no edema    Data Reviewed: I have personally reviewed following labs and imaging studies  CBC: Recent Labs  Lab 03/01/23 1303 03/02/23 0423 03/03/23 0503 03/04/23 0450  WBC 12.8* 8.8 7.4 7.1  NEUTROABS 9.7*  --   --   --  HGB 10.8* 9.4* 8.9* 9.6*  HCT 32.0* 27.9* 26.6* 28.3*  MCV 87.9 88.3 88.1 88.7  PLT 306 279 292 357   Basic Metabolic Panel: Recent Labs  Lab 03/01/23 1303 03/02/23 0423 03/03/23 0503 03/04/23 0450  NA 136 136 132* 139  K 4.2 4.1 3.9 3.8  CL 107 110 106 112*  CO2 19* 18* 18* 19*  GLUCOSE 187* 132* 361* 134*  BUN 42* 39* 39* 31*  CREATININE 3.39* 3.27* 3.49* 3.47*  CALCIUM  8.6* 8.1* 7.6* 8.2*  PHOS  --  3.0  --   --     GFR: Estimated Creatinine Clearance: 23.8 mL/min (A) (by C-G formula based on SCr of 3.47 mg/dL (H)). Liver Function Tests: Recent Labs  Lab 03/01/23 1922 03/02/23 0423  AST 15  --   ALT 12  --   ALKPHOS 104  --   BILITOT 0.5  --   PROT 6.4*  --   ALBUMIN 2.4* 1.9*   No results for input(s): LIPASE, AMYLASE in the last 168 hours. No results for input(s): AMMONIA in the last 168 hours. Coagulation Profile: No results for input(s): INR, PROTIME in the last 168 hours. Cardiac Enzymes: No results for input(s): CKTOTAL, CKMB, CKMBINDEX, TROPONINI in the last 168 hours. BNP (last 3 results) No results for input(s): PROBNP in the last 8760 hours. HbA1C: Recent Labs    03/02/23 0423  HGBA1C 7.6*   CBG: Recent Labs  Lab 03/02/23 2051 03/03/23 0731 03/03/23 1140 03/03/23 1642 03/03/23 2125  GLUCAP 147* 307* 87 100* 157*   Lipid Profile: No results for input(s): CHOL, HDL, LDLCALC, TRIG, CHOLHDL, LDLDIRECT in the last 72 hours. Thyroid Function Tests: No results for input(s): TSH, T4TOTAL, FREET4, T3FREE, THYROIDAB in the last 72 hours. Anemia Panel: No results for input(s): VITAMINB12, FOLATE, FERRITIN, TIBC, IRON, RETICCTPCT in the last 72 hours. Sepsis Labs: No results for input(s): PROCALCITON, LATICACIDVEN in the last 168 hours.  Recent Results (from the past 240 hours)  Resp panel by RT-PCR (RSV, Flu A&B, Covid) Anterior Nasal Swab     Status: Abnormal   Collection Time: 02/26/23 11:46 PM   Specimen: Anterior Nasal Swab  Result Value Ref Range Status   SARS Coronavirus 2 by RT PCR NEGATIVE NEGATIVE Final    Comment: (NOTE) SARS-CoV-2 target nucleic acids are NOT DETECTED.  The SARS-CoV-2 RNA is generally detectable in upper respiratory specimens during the acute phase of infection. The lowest concentration of SARS-CoV-2 viral copies this assay can detect is 138 copies/mL. A negative result does not  preclude SARS-Cov-2 infection and should not be used as the sole basis for treatment or other patient management decisions. A negative result may occur with  improper specimen collection/handling, submission of specimen other than nasopharyngeal swab, presence of viral mutation(s) within the areas targeted by this assay, and inadequate number of viral copies(<138 copies/mL). A negative result must be combined with clinical observations, patient history, and epidemiological information. The expected result is Negative.  Fact Sheet for Patients:  bloggercourse.com  Fact Sheet for Healthcare Providers:  seriousbroker.it  This test is no t yet approved or cleared by the [place]  FDA and  has been authorized for detection and/or diagnosis of SARS-CoV-2 by FDA under an Emergency Use Authorization (EUA). This EUA will remain  in effect (meaning this test can be used) for the duration of the COVID-19 declaration under Section 564(b)(1) of the Act, 21 U.S.C.section 360bbb-3(b)(1), unless the authorization is terminated  or revoked sooner.  Influenza A by PCR POSITIVE (A) NEGATIVE Final   Influenza B by PCR NEGATIVE NEGATIVE Final    Comment: (NOTE) The Xpert Xpress SARS-CoV-2/FLU/RSV plus assay is intended as an aid in the diagnosis of influenza from Nasopharyngeal swab specimens and should not be used as a sole basis for treatment. Nasal washings and aspirates are unacceptable for Xpert Xpress SARS-CoV-2/FLU/RSV testing.  Fact Sheet for Patients: bloggercourse.com  Fact Sheet for Healthcare Providers: seriousbroker.it  This test is not yet approved or cleared by the [place]  FDA and has been authorized for detection and/or diagnosis of SARS-CoV-2 by FDA under an Emergency Use Authorization (EUA). This EUA will remain in effect (meaning this test can be used) for the  duration of the COVID-19 declaration under Section 564(b)(1) of the Act, 21 U.S.C. section 360bbb-3(b)(1), unless the authorization is terminated or revoked.     Resp Syncytial Virus by PCR NEGATIVE NEGATIVE Final    Comment: (NOTE) Fact Sheet for Patients: bloggercourse.com  Fact Sheet for Healthcare Providers: seriousbroker.it  This test is not yet approved or cleared by the [place]  FDA and has been authorized for detection and/or diagnosis of SARS-CoV-2 by FDA under an Emergency Use Authorization (EUA). This EUA will remain in effect (meaning this test can be used) for the duration of the COVID-19 declaration under Section 564(b)(1) of the Act, 21 U.S.C. section 360bbb-3(b)(1), unless the authorization is terminated or revoked.  Performed at [facility], [address] [place], [place] [address]   Group A Strep by PCR [facility] Only)     Status: None   Collection Time: 02/26/23 11:46 PM   Specimen: Throat; Sterile Swab  Result Value Ref Range Status   Group A Strep by PCR NOT DETECTED NOT DETECTED Final    Comment: Performed at [facility], [address], [place], [place] [address]         Radiology Studies: MR BRAIN WO CONTRAST Result Date: 03/02/2023 CLINICAL DATA:  Uveitis or scleritis suspected, headache EXAM: MRI HEAD AND ORBITS WITHOUT CONTRAST TECHNIQUE: Multiplanar, multi-echo pulse sequences of the brain and surrounding structures were acquired without intravenous contrast. Multiplanar, multi-echo pulse sequences of the orbits and surrounding structures were acquired including fat saturation techniques, without intravenous contrast administration. COMPARISON:  No prior MRI available, correlation is made with CT head 03/01/2023 FINDINGS: MRI HEAD FINDINGS Brain: No restricted diffusion to suggest acute or subacute infarct. No acute hemorrhage, mass, mass effect, or midline shift. No  hydrocephalus or extra-axial collection. Scattered T2 hyperintense signal in the periventricular white matter, likely the sequela of chronic small vessel ischemic disease. Pituitary and craniocervical junction are within normal limits. Punctate foci of hemosiderin deposition in the periventricular white matter, likely sequela of prior chronic hypertensive microhemorrhages. No evidence remote cortical or significant lacunar infarct. Vascular: Normal arterial flow voids. Skull and upper cervical spine: Normal marrow signal. Other: Fluid in the bilateral mastoid air cells and left middle ear. MRI ORBITS FINDINGS Evaluation is somewhat limited by the absence of intravenous contrast. Orbits: No orbital mass or evidence of inflammation. Normal appearance of the globes, optic nerve-sheath complexes, extraocular muscles, orbital fat and lacrimal glands. Visualized sinuses: Mucosal thickening in the left greater than right maxillary sinus, sphenoid sinus, and ethmoid air cells. Soft tissues: Normal. IMPRESSION: 1. No acute intracranial process. No evidence of acute or subacute infarct. 2. No acute process in the orbits. 3. Fluid in the left mastoid air cells and middle ear, which is nonspecific but can be seen in  the setting of otomastoiditis. 4. Left-greater-than-right paranasal sinus disease, as can be seen in the setting of acute sinusitis. Correlate with symptoms. Electronically Signed   By: Donald Campion M.D.   On: 03/02/2023 17:49   MR ORBITS WO CONTRAST Result Date: 03/02/2023 CLINICAL DATA:  Uveitis or scleritis suspected, headache EXAM: MRI HEAD AND ORBITS WITHOUT CONTRAST TECHNIQUE: Multiplanar, multi-echo pulse sequences of the brain and surrounding structures were acquired without intravenous contrast. Multiplanar, multi-echo pulse sequences of the orbits and surrounding structures were acquired including fat saturation techniques, without intravenous contrast administration. COMPARISON:  No prior MRI  available, correlation is made with CT head 03/01/2023 FINDINGS: MRI HEAD FINDINGS Brain: No restricted diffusion to suggest acute or subacute infarct. No acute hemorrhage, mass, mass effect, or midline shift. No hydrocephalus or extra-axial collection. Scattered T2 hyperintense signal in the periventricular white matter, likely the sequela of chronic small vessel ischemic disease. Pituitary and craniocervical junction are within normal limits. Punctate foci of hemosiderin deposition in the periventricular white matter, likely sequela of prior chronic hypertensive microhemorrhages. No evidence remote cortical or significant lacunar infarct. Vascular: Normal arterial flow voids. Skull and upper cervical spine: Normal marrow signal. Other: Fluid in the bilateral mastoid air cells and left middle ear. MRI ORBITS FINDINGS Evaluation is somewhat limited by the absence of intravenous contrast. Orbits: No orbital mass or evidence of inflammation. Normal appearance of the globes, optic nerve-sheath complexes, extraocular muscles, orbital fat and lacrimal glands. Visualized sinuses: Mucosal thickening in the left greater than right maxillary sinus, sphenoid sinus, and ethmoid air cells. Soft tissues: Normal. IMPRESSION: 1. No acute intracranial process. No evidence of acute or subacute infarct. 2. No acute process in the orbits. 3. Fluid in the left mastoid air cells and middle ear, which is nonspecific but can be seen in the setting of otomastoiditis. 4. Left-greater-than-right paranasal sinus disease, as can be seen in the setting of acute sinusitis. Correlate with symptoms. Electronically Signed   By: Donald Campion M.D.   On: 03/02/2023 17:49        Scheduled Meds:  amLODipine   5 mg Oral Daily   feeding supplement (NEPRO CARB STEADY)  237 mL Oral TID BM   fluticasone   1 spray Each Nare Daily   heparin   5,000 Units Subcutaneous Q8H   insulin  aspart  0-15 Units Subcutaneous TID WC   insulin  aspart  0-5 Units  Subcutaneous QHS   insulin  glargine-yfgn  10 Units Subcutaneous Daily   [START ON 03/05/2023] levofloxacin   250 mg Oral Daily   linezolid   600 mg Oral Q12H   oseltamivir   30 mg Oral Daily   oxymetazoline   1 spray Each Nare BID   Continuous Infusions:  sodium chloride  Stopped (03/04/23 1147)     LOS: 3 days    Time spent: 35 minutes    Ieesha Abbasi A Jenah Vanasten, MD [facility]   If 7PM-7AM, please contact night-coverage [email or web address]  03/04/2023, 2:29 PM

## 2023-03-04 NOTE — Plan of Care (Signed)
  Problem: Clinical Measurements: Goal: Diagnostic test results will improve Outcome: Progressing Goal: Respiratory complications will improve Outcome: Progressing Goal: Cardiovascular complication will be avoided Outcome: Progressing   Problem: Metabolic: Goal: Ability to maintain appropriate glucose levels will improve Outcome: Progressing

## 2023-03-05 DIAGNOSIS — N179 Acute kidney failure, unspecified: Secondary | ICD-10-CM | POA: Diagnosis not present

## 2023-03-05 LAB — GLUCOSE, CAPILLARY
Glucose-Capillary: 122 mg/dL — ABNORMAL HIGH (ref 70–99)
Glucose-Capillary: 141 mg/dL — ABNORMAL HIGH (ref 70–99)
Glucose-Capillary: 150 mg/dL — ABNORMAL HIGH (ref 70–99)
Glucose-Capillary: 180 mg/dL — ABNORMAL HIGH (ref 70–99)
Glucose-Capillary: 143 mg/dL — ABNORMAL HIGH (ref 70–99)
Glucose-Capillary: 205 mg/dL — ABNORMAL HIGH (ref 70–99)
Glucose-Capillary: 133 mg/dL — ABNORMAL HIGH (ref 70–99)

## 2023-03-05 LAB — CBC
HCT: 27.8 % — ABNORMAL LOW (ref 39.0–52.0)
Hemoglobin: 9.2 g/dL — ABNORMAL LOW (ref 13.0–17.0)
MCH: 29.9 pg (ref 26.0–34.0)
MCHC: 33.1 g/dL (ref 30.0–36.0)
MCV: 90.3 fL (ref 80.0–100.0)
Platelets: 308 10*3/uL (ref 150–400)
RBC: 3.08 MIL/uL — ABNORMAL LOW (ref 4.22–5.81)
RDW: 13.6 % (ref 11.5–15.5)
WBC: 6.5 10*3/uL (ref 4.0–10.5)
nRBC: 0 % (ref 0.0–0.2)

## 2023-03-05 LAB — BASIC METABOLIC PANEL
Anion gap: 7 (ref 5–15)
BUN: 29 mg/dL — ABNORMAL HIGH (ref 8–23)
CO2: 19 mmol/L — ABNORMAL LOW (ref 22–32)
Calcium: 7.9 mg/dL — ABNORMAL LOW (ref 8.9–10.3)
Chloride: 110 mmol/L (ref 98–111)
Creatinine, Ser: 3.75 mg/dL — ABNORMAL HIGH (ref 0.61–1.24)
GFR, Estimated: 18 mL/min — ABNORMAL LOW (ref >60)
Glucose, Bld: 226 mg/dL — ABNORMAL HIGH (ref 70–99)
Potassium: 3.8 mmol/L (ref 3.5–5.1)
Sodium: 136 mmol/L (ref 135–145)

## 2023-03-05 LAB — PROTEIN / CREATININE RATIO, URINE
Creatinine, Urine: 110 mg/dL
Protein Creatinine Ratio: 8.64 mg/mg{creat} — ABNORMAL HIGH (ref 0.00–0.15)
Total Protein, Urine: 950 mg/dL

## 2023-03-05 MED ORDER — LACTATED RINGERS IV BOLUS
250.0000 mL | Freq: Once | INTRAVENOUS | Status: AC
  Administered 2023-03-05: 250 mL via INTRAVENOUS

## 2023-03-05 NOTE — Consult Note (Signed)
 Reason for Consult:Renal failure Referring Physician: Dr. Marcelle Sergeant  Chief Complaint: Headache and altered mental status  Assessment/Plan: Renal failure in the setting of influenza A, otomastoiditis.  May have been prerenal initially but likely has progressed to ATN in the setting of an infectious etiology, prerenal state, relative hypotension in the context of initial severe hypertension presentation in the emergency department with a blood pressure of 194/94.  Proteinuria is present and nonnephrotic range and and not active urine sediment.  Renal ultrasound is not consistent with obstruction show chronic kidney disease. -Will check a UPC; do not think urine lytes will be helpful. -Encouraged patient to drink if he is thirsty; he is not uremic and although his appetite is not great he has no nausea and is taking p.o. adequately.  Urine output is picking up as well and not all recorded as the amount voided overnight and this morning exceeded the container leading to a significant amount being flushed and not recorded.   -No indication for dialysis fortunately and hopefully this increased urine output represents the polyuric phase of ATN.  -Monitor Daily I/Os, Daily weight  -Maintain MAP>65 for optimal renal perfusion.  - Avoid nephrotoxic agents such as IV contrast, NSAIDs, and phosphate containing bowel preps (FLEETS)   Influenza A -on Tamiflu  but may be outside the window Hypertension - not easy but avoid overly rapid  drops in blood pressure to maintain adequate vital organ perfusion.(Goal no more than 30% drop for day to day); doing better now with less variation during the day Otomastoiditis -on Zyvox  and Levaquin  Anemia -transfuse as needed, even if he were iron deficient he is not a candidate for IV iron replacement in the setting of an active infection. DM   HPI: Joshua Galvan is an 63 y.o. male with a past medical history of hypertension, hyperlipidemia, diabetes recently  diagnosed with influenza a associated with malaise and weakness.  Patient is coming in with headache and found in the emergency room to be hypertensive with altered mental status.  He was also complaining of cough, shortness of breath and congestion.  Head CT showed no acute intracranial process, fluid in the left mastoid air cells and middle ear, paranasal sinus disease, with air-fluid levels in the left sphenoid sinus and right maxillary sinus.  Patient's baseline creatinine is less than 1 but on presentation it was 3.39 with leukocytosis and anemia.  Patient was thought to be dehydrated.  Blood pressure in the emergency department was 194/94, patient's blood pressure has dropped as low as 117 in the 122/72.  He denies any fever chills, rashes, dysuria, obstructive like symptoms, flank pain.  ROS Pertinent items are noted in HPI.  Chemistry and CBC: Creatinine  Date/Time Value Ref Range Status  09/19/2013 07:23 PM 0.87 0.60 - 1.30 mg/dL Final  11/91/4782 95:62 PM 0.84 0.60 - 1.30 mg/dL Final   Creatinine, Ser  Date/Time Value Ref Range Status  03/05/2023 04:05 AM 3.75 (H) 0.61 - 1.24 mg/dL Final  13/08/6576 46:96 AM 3.47 (H) 0.61 - 1.24 mg/dL Final  29/52/8413 24:40 AM 3.49 (H) 0.61 - 1.24 mg/dL Final  11/19/2534 64:40 AM 3.27 (H) 0.61 - 1.24 mg/dL Final  34/74/2595 63:87 PM 3.39 (H) 0.61 - 1.24 mg/dL Final   Recent Labs  Lab 03/01/23 1303 03/02/23 0423 03/03/23 0503 03/04/23 0450 03/05/23 0405  NA 136 136 132* 139 136  K 4.2 4.1 3.9 3.8 3.8  CL 107 110 106 112* 110  CO2 19* 18* 18* 19* 19*  GLUCOSE  187* 132* 361* 134* 226*  BUN 42* 39* 39* 31* 29*  CREATININE 3.39* 3.27* 3.49* 3.47* 3.75*  CALCIUM  8.6* 8.1* 7.6* 8.2* 7.9*  PHOS  --  3.0  --   --   --    Recent Labs  Lab 03/01/23 1303 03/02/23 0423 03/03/23 0503 03/04/23 0450 03/05/23 0405  WBC 12.8* 8.8 7.4 7.1 6.5  NEUTROABS 9.7*  --   --   --   --   HGB 10.8* 9.4* 8.9* 9.6* 9.2*  HCT 32.0* 27.9* 26.6* 28.3* 27.8*   MCV 87.9 88.3 88.1 88.7 90.3  PLT 306 279 292 357 308   Liver Function Tests: Recent Labs  Lab 03/01/23 1922 03/02/23 0423  AST 15  --   ALT 12  --   ALKPHOS 104  --   BILITOT 0.5  --   PROT 6.4*  --   ALBUMIN 2.4* 1.9*   No results for input(s): "LIPASE", "AMYLASE" in the last 168 hours. No results for input(s): "AMMONIA" in the last 168 hours. Cardiac Enzymes: No results for input(s): "CKTOTAL", "CKMB", "CKMBINDEX", "TROPONINI" in the last 168 hours. Iron Studies: No results for input(s): "IRON", "TIBC", "TRANSFERRIN", "FERRITIN" in the last 72 hours. PT/INR: @LABRCNTIP (inr:5)  Xrays/Other Studies: ) Results for orders placed or performed during the hospital encounter of 03/01/23 (from the past 48 hours)  Glucose, capillary     Status: None   Collection Time: 03/03/23 11:40 AM  Result Value Ref Range   Glucose-Capillary 87 70 - 99 mg/dL    Comment: Glucose reference range applies only to samples taken after fasting for at least 8 hours.  Glucose, capillary     Status: Abnormal   Collection Time: 03/03/23  4:42 PM  Result Value Ref Range   Glucose-Capillary 100 (H) 70 - 99 mg/dL    Comment: Glucose reference range applies only to samples taken after fasting for at least 8 hours.  Glucose, capillary     Status: Abnormal   Collection Time: 03/03/23  9:25 PM  Result Value Ref Range   Glucose-Capillary 157 (H) 70 - 99 mg/dL    Comment: Glucose reference range applies only to samples taken after fasting for at least 8 hours.   Comment 1 Notify RN   CBC     Status: Abnormal   Collection Time: 03/04/23  4:50 AM  Result Value Ref Range   WBC 7.1 4.0 - 10.5 K/uL   RBC 3.19 (L) 4.22 - 5.81 MIL/uL   Hemoglobin 9.6 (L) 13.0 - 17.0 g/dL   HCT 16.1 (L) 09.6 - 04.5 %   MCV 88.7 80.0 - 100.0 fL   MCH 30.1 26.0 - 34.0 pg   MCHC 33.9 30.0 - 36.0 g/dL   RDW 40.9 81.1 - 91.4 %   Platelets 357 150 - 400 K/uL   nRBC 0.0 0.0 - 0.2 %    Comment: Performed at [facility], [address], [place], [place] [address]  Basic metabolic panel     Status: Abnormal   Collection Time: 03/04/23  4:50 AM  Result Value Ref Range   Sodium 139 135 - 145 mmol/L    Comment: DELTA CHECK NOTED   Potassium 3.8 3.5 - 5.1 mmol/L   Chloride 112 (H) 98 - 111 mmol/L   CO2 19 (L) 22 - 32 mmol/L   Glucose, Bld 134 (H) 70 - 99 mg/dL    Comment: Glucose reference range applies only to samples taken after fasting for at least 8  hours.   BUN 31 (H) 8 - 23 mg/dL   Creatinine, Ser 4.09 (H) 0.61 - 1.24 mg/dL   Calcium  8.2 (L) 8.9 - 10.3 mg/dL   GFR, Estimated 19 (L) >60 mL/min    Comment: (NOTE) Calculated using the CKD-EPI Creatinine Equation (2021)    Anion gap 8 5 - 15    Comment: Performed at [facility], [address], [place], [place] [address]  Glucose, capillary     Status: Abnormal   Collection Time: 03/04/23  7:42 AM  Result Value Ref Range   Glucose-Capillary 122 (H) 70 - 99 mg/dL    Comment: Glucose reference range applies only to samples taken after fasting for at least 8 hours.  Glucose, capillary     Status: Abnormal   Collection Time: 03/04/23 11:50 AM  Result Value Ref Range   Glucose-Capillary 141 (H) 70 - 99 mg/dL    Comment: Glucose reference range applies only to samples taken after fasting for at least 8 hours.  Glucose, capillary     Status: Abnormal   Collection Time: 03/04/23  5:09 PM  Result Value Ref Range   Glucose-Capillary 188 (H) 70 - 99 mg/dL    Comment: Glucose reference range applies only to samples taken after fasting for at least 8 hours.  Glucose, capillary     Status: Abnormal   Collection Time: 03/04/23  9:28 PM  Result Value Ref Range   Glucose-Capillary 150 (H) 70 - 99 mg/dL    Comment: Glucose reference range applies only to samples taken after fasting for at least 8 hours.  CBC     Status: Abnormal   Collection Time: 03/05/23  4:05 AM  Result Value Ref Range   WBC 6.5 4.0 - 10.5 K/uL   RBC 3.08 (L)  4.22 - 5.81 MIL/uL   Hemoglobin 9.2 (L) 13.0 - 17.0 g/dL   HCT 47.8 (L) 29.5 - 62.1 %   MCV 90.3 80.0 - 100.0 fL   MCH 29.9 26.0 - 34.0 pg   MCHC 33.1 30.0 - 36.0 g/dL   RDW 30.8 65.7 - 84.6 %   Platelets 308 150 - 400 K/uL   nRBC 0.0 0.0 - 0.2 %    Comment: Performed at [facility], [address] [place], [place] [address]  Basic metabolic panel     Status: Abnormal   Collection Time: 03/05/23  4:05 AM  Result Value Ref Range   Sodium 136 135 - 145 mmol/L   Potassium 3.8 3.5 - 5.1 mmol/L   Chloride 110 98 - 111 mmol/L   CO2 19 (L) 22 - 32 mmol/L   Glucose, Bld 226 (H) 70 - 99 mg/dL    Comment: Glucose reference range applies only to samples taken after fasting for at least 8 hours.   BUN 29 (H) 8 - 23 mg/dL   Creatinine, Ser 2.84 (H) 0.61 - 1.24 mg/dL   Calcium  7.9 (L) 8.9 - 10.3 mg/dL   GFR, Estimated 18 (L) >60 mL/min    Comment: (NOTE) Calculated using the CKD-EPI Creatinine Equation (2021)    Anion gap 7 5 - 15    Comment: Performed at [facility], [address], [place], [place] [address]  Glucose, capillary     Status: Abnormal   Collection Time: 03/05/23  7:28 AM  Result Value Ref Range   Glucose-Capillary 180 (H) 70 - 99 mg/dL    Comment: Glucose reference range applies only to samples taken after fasting  for at least 8 hours.   No results found.  PMH:   Past Medical History:  Diagnosis Date   Diabetes mellitus without complication (HCC)    Hyperlipidemia    Hypertension     PSH:  History reviewed. No pertinent surgical history.  Allergies: No Known Allergies  Medications:   Prior to Admission medications   Medication Sig Start Date End Date Taking? Authorizing Provider  acetaminophen  (TYLENOL ) 500 MG tablet Take 500-1,500 mg by mouth every 6 (six) hours as needed for mild pain (pain score 1-3) or moderate pain (pain score 4-6).   Yes [provider]  butalbital -acetaminophen -caffeine  (FIORICET ) 50-325-40  MG tablet Take 1-2 tablets by mouth every 6 (six) hours as needed for headache. 02/27/23 02/27/24 Yes Bryson Carbine, MD  losartan-hydrochlorothiazide (HYZAAR) 100-25 MG tablet Take 1 tablet by mouth in the morning.   Yes [provider]  ibuprofen  (ADVIL ) 600 MG tablet Take 1 tablet (600 mg total) by mouth every 6 (six) hours as needed. Patient not taking: Reported on 03/01/2023 01/05/21   Lind Repine, MD    Discontinued Meds:   Medications Discontinued During This Encounter  Medication Reason   acetaminophen  (TYLENOL ) tablet 1,000 mg    cefTRIAXone  (ROCEPHIN ) 1 g in sodium chloride  0.9 % 100 mL IVPB    0.9 %  sodium chloride  infusion    metroNIDAZOLE  (FLAGYL ) IVPB 500 mg    ceFEPIme (MAXIPIME) 2 g in sodium chloride  0.9 % 100 mL IVPB    oseltamivir  (TAMIFLU ) capsule 30 mg    feeding supplement (NEPRO CARB STEADY) liquid 237 mL    piperacillin -tazobactam (ZOSYN ) IVPB 3.375 g    linezolid  (ZYVOX ) IVPB 600 mg P&T Policy: IV to PO Conversion    Social History:  reports that he has never smoked. He has never used smokeless tobacco. He reports current alcohol use. He reports that he does not use drugs.  Family History:  History reviewed. No pertinent family history.  Blood pressure (!) 154/76, pulse 88, temperature 98.2 F (36.8 C), temperature source Oral, resp. rate 14, height 5\' 11"  (1.803 m), weight 81.9 kg, SpO2 99%. General appearance: alert, cooperative, and appears stated age Head: Normocephalic, without obvious abnormality, atraumatic Eyes: negative Back: symmetric, no curvature. ROM normal. No CVA tenderness. Resp: clear to auscultation bilaterally Cardio: regular rate and rhythm GI: soft, non-tender; bowel sounds normal; no masses,  no organomegaly Extremities: extremities normal, atraumatic, no cyanosis or edema Pulses: 2+ and symmetric Skin: Skin color, texture, turgor normal. No rashes or lesions       Babs Dabbs, Alveda Aures, MD 03/05/2023, 9:56 AM

## 2023-03-05 NOTE — Progress Notes (Signed)
 PROGRESS NOTE    Joshua Galvan  QQV:[id number] DOB: 1960/11/04 DOA: 03/01/2023 PCP: System, Provider Not In   Brief Narrative: 63 year old with past medical history significant for essential hypertension, hyperlipidemia, diabetes type 2 recently diagnosed with influenza A has been feeling weak for the last week, reports poor oral intake.  Patient admitted with AKI, creatinine 3.3, previous creatinine available 2 years ago was 1.2.    Assessment & Plan:   Principal Problem:   AKI (acute kidney injury) (HCC) Active Problems:   Chronic kidney disease, stage 3a (HCC)   Diabetes mellitus type 2 with complications (HCC)   Essential hypertension   Hyperlipidemia  1-AKI: -In the setting of hypovolemia, poor oral intake, diuretics ARB, and ATN -Present with a creatinine of 3.3.  Previous creatinine 2 years ago 1.2 -Strict I's and O's -Renal US : Increased echogenicity of renal parenchyma is noted bilaterally suggesting medical renal disease. No hydronephrosis or renal obstruction is noted. -Continue with IV fluids.  No improvement. Nephrology consulted.    Influenza A: -Symptoms started 4 days ago, now he is requiring hospitalization will start Tamiflu .  Improving.   Severe headache: -suspect related to sinusitis.  -MRI brain, Orbit negative -Resume Fioricet .  Headaches improved.   Diabetes type II -Does not appear to be of medication as an outpatient -A1c 7.6 - sliding scale insulin  -Started  long acting CBG 300\while in the hospital.   Hypertension: -Continue to hold losartan and hydrochlorothiazide. Started  Norvas.  PRN Hydralazine    Leukocytosis: Otomastoiditis, maxillary sinusitis sphenoid sinusitis, chronic left maxillary sinusitis -Continue with  IV linezild.  -Orbital MRI and brain MRI due to severe Headaches. Negative Change ZOsyn  to Levaquin  to avoid affection of renal function from Zosyn        Estimated body mass index is 25.18 kg/m as  calculated from the following:   Height as of this encounter: 5\' 11"  (1.803 m).   Weight as of this encounter: 81.9 kg.   DVT prophylaxis: Heparin   Code Status: Full code Family Communication: Wife at bedside Disposition Plan:  Status is: Inpatient Remains inpatient appropriate because: management of AKI    Consultants:  none  Procedures:  none  Antimicrobials:    Subjective: Headaches better, appetitive improved today   Objective: Vitals:   03/05/23 1032 03/05/23 1033 03/05/23 1049 03/05/23 1154  BP: (!) 170/85 (!) 170/85 (!) 170/85 (!) 106/50  Pulse:  79  95  Resp:  17  16  Temp:  98 F (36.7 C)  97.8 F (36.6 C)  TempSrc:  Oral  Oral  SpO2:  100%  99%  Weight:      Height:        Intake/Output Summary (Last 24 hours) at 03/05/2023 1252 Last data filed at 03/05/2023 0730 Gross per 24 hour  Intake 3097.07 ml  Output --  Net 3097.07 ml   Filed Weights   03/02/23 0707 03/03/23 0448 03/04/23 0441  Weight: 81.7 kg 81.5 kg 81.9 kg    Examination:  General exam: NAD Respiratory system: CTA Cardiovascular system: S 1, S 2 RRR  Gastrointestinal system: BS present, soft, nt Extremities: no edema    Data Reviewed: I have personally reviewed following labs and imaging studies  CBC: Recent Labs  Lab 03/01/23 1303 03/02/23 0423 03/03/23 0503 03/04/23 0450 03/05/23 0405  WBC 12.8* 8.8 7.4 7.1 6.5  NEUTROABS 9.7*  --   --   --   --   HGB 10.8* 9.4* 8.9* 9.6* 9.2*  HCT 32.0* 27.9* 26.6* 28.3*  27.8*  MCV 87.9 88.3 88.1 88.7 90.3  PLT 306 279 292 357 308   Basic Metabolic Panel: Recent Labs  Lab 03/01/23 1303 03/02/23 0423 03/03/23 0503 03/04/23 0450 03/05/23 0405  NA 136 136 132* 139 136  K 4.2 4.1 3.9 3.8 3.8  CL 107 110 106 112* 110  CO2 19* 18* 18* 19* 19*  GLUCOSE 187* 132* 361* 134* 226*  BUN 42* 39* 39* 31* 29*  CREATININE 3.39* 3.27* 3.49* 3.47* 3.75*  CALCIUM  8.6* 8.1* 7.6* 8.2* 7.9*  PHOS  --  3.0  --   --   --    GFR: Estimated  Creatinine Clearance: 22 mL/min (A) (by C-G formula based on SCr of 3.75 mg/dL (H)). Liver Function Tests: Recent Labs  Lab 03/01/23 1922 03/02/23 0423  AST 15  --   ALT 12  --   ALKPHOS 104  --   BILITOT 0.5  --   PROT 6.4*  --   ALBUMIN 2.4* 1.9*   No results for input(s): "LIPASE", "AMYLASE" in the last 168 hours. No results for input(s): "AMMONIA" in the last 168 hours. Coagulation Profile: No results for input(s): "INR", "PROTIME" in the last 168 hours. Cardiac Enzymes: No results for input(s): "CKTOTAL", "CKMB", "CKMBINDEX", "TROPONINI" in the last 168 hours. BNP (last 3 results) No results for input(s): "PROBNP" in the last 8760 hours. HbA1C: No results for input(s): "HGBA1C" in the last 72 hours.  CBG: Recent Labs  Lab 03/04/23 1150 03/04/23 1709 03/04/23 2128 03/05/23 0728 03/05/23 1155  GLUCAP 141* 188* 150* 180* 143*   Lipid Profile: No results for input(s): "CHOL", "HDL", "LDLCALC", "TRIG", "CHOLHDL", "LDLDIRECT" in the last 72 hours. Thyroid Function Tests: No results for input(s): "TSH", "T4TOTAL", "FREET4", "T3FREE", "THYROIDAB" in the last 72 hours. Anemia Panel: No results for input(s): "VITAMINB12", "FOLATE", "FERRITIN", "TIBC", "IRON", "RETICCTPCT" in the last 72 hours. Sepsis Labs: No results for input(s): "PROCALCITON", "LATICACIDVEN" in the last 168 hours.  Recent Results (from the past 240 hours)  Resp panel by RT-PCR (RSV, Flu A&B, Covid) Anterior Nasal Swab     Status: Abnormal   Collection Time: 02/26/23 11:46 PM   Specimen: Anterior Nasal Swab  Result Value Ref Range Status   SARS Coronavirus 2 by RT PCR NEGATIVE NEGATIVE Final    Comment: (NOTE) SARS-CoV-2 target nucleic acids are NOT DETECTED.  The SARS-CoV-2 RNA is generally detectable in upper respiratory specimens during the acute phase of infection. The lowest concentration of SARS-CoV-2 viral copies this assay can detect is 138 copies/mL. A negative result does not preclude  SARS-Cov-2 infection and should not be used as the sole basis for treatment or other patient management decisions. A negative result may occur with  improper specimen collection/handling, submission of specimen other than nasopharyngeal swab, presence of viral mutation(s) within the areas targeted by this assay, and inadequate number of viral copies(<138 copies/mL). A negative result must be combined with clinical observations, patient history, and epidemiological information. The expected result is Negative.  Fact Sheet for Patients:  BloggerCourse.com  Fact Sheet for Healthcare Providers:  SeriousBroker.it  This test is no t yet approved or cleared by the [place]  FDA and  has been authorized for detection and/or diagnosis of SARS-CoV-2 by FDA under an Emergency Use Authorization (EUA). This EUA will remain  in effect (meaning this test can be used) for the duration of the COVID-19 declaration under Section 564(b)(1) of the Act, 21 U.S.C.section 360bbb-3(b)(1), unless the authorization is terminated  or revoked sooner.  Influenza A by PCR POSITIVE (A) NEGATIVE Final   Influenza B by PCR NEGATIVE NEGATIVE Final    Comment: (NOTE) The Xpert Xpress SARS-CoV-2/FLU/RSV plus assay is intended as an aid in the diagnosis of influenza from Nasopharyngeal swab specimens and should not be used as a sole basis for treatment. Nasal washings and aspirates are unacceptable for Xpert Xpress SARS-CoV-2/FLU/RSV testing.  Fact Sheet for Patients: BloggerCourse.com  Fact Sheet for Healthcare Providers: SeriousBroker.it  This test is not yet approved or cleared by the [place]  FDA and has been authorized for detection and/or diagnosis of SARS-CoV-2 by FDA under an Emergency Use Authorization (EUA). This EUA will remain in effect (meaning this test can be used) for the duration of  the COVID-19 declaration under Section 564(b)(1) of the Act, 21 U.S.C. section 360bbb-3(b)(1), unless the authorization is terminated or revoked.     Resp Syncytial Virus by PCR NEGATIVE NEGATIVE Final    Comment: (NOTE) Fact Sheet for Patients: BloggerCourse.com  Fact Sheet for Healthcare Providers: SeriousBroker.it  This test is not yet approved or cleared by the [place]  FDA and has been authorized for detection and/or diagnosis of SARS-CoV-2 by FDA under an Emergency Use Authorization (EUA). This EUA will remain in effect (meaning this test can be used) for the duration of the COVID-19 declaration under Section 564(b)(1) of the Act, 21 U.S.C. section 360bbb-3(b)(1), unless the authorization is terminated or revoked.  Performed at [facility], [address] [place], [place] [address]   Group A Strep by PCR [facility] Only)     Status: None   Collection Time: 02/26/23 11:46 PM   Specimen: Throat; Sterile Swab  Result Value Ref Range Status   Group A Strep by PCR NOT DETECTED NOT DETECTED Final    Comment: Performed at [facility], [address], [place], [place] [address]         Radiology Studies: No results found.       Scheduled Meds:  amLODipine   5 mg Oral Daily   feeding supplement (NEPRO CARB STEADY)  237 mL Oral TID BM   fluticasone   1 spray Each Nare Daily   heparin   5,000 Units Subcutaneous Q8H   insulin  aspart  0-15 Units Subcutaneous TID WC   insulin  aspart  0-5 Units Subcutaneous QHS   insulin  glargine-yfgn  10 Units Subcutaneous Daily   levofloxacin   250 mg Oral Daily   linezolid   600 mg Oral Q12H   oseltamivir   30 mg Oral Daily   Continuous Infusions:  sodium chloride  100 mL/hr at 03/04/23 2300     LOS: 4 days    Time spent: 35 minutes    Angeleah Labrake A Sagan Wurzel, MD [facility]   If 7PM-7AM, please contact night-coverage [email or web address]  03/05/2023, 12:52  PM

## 2023-03-05 NOTE — Plan of Care (Signed)
 Problem: Education: Goal: Knowledge of General Education information will improve Description: Including pain rating scale, medication(s)/side effects and non-pharmacologic comfort measures 03/05/2023 1512 by Lorretta Rossetti, RN Outcome: Progressing 03/05/2023 1508 by Lorretta Rossetti, RN Outcome: Progressing   Problem: Health Behavior/Discharge Planning: Goal: Ability to manage health-related needs will improve 03/05/2023 1512 by Lorretta Rossetti, RN Outcome: Progressing 03/05/2023 1508 by Lorretta Rossetti, RN Outcome: Progressing   Problem: Clinical Measurements: Goal: Ability to maintain clinical measurements within normal limits will improve 03/05/2023 1512 by Lorretta Rossetti, RN Outcome: Progressing 03/05/2023 1508 by Lorretta Rossetti, RN Outcome: Progressing Goal: Will remain free from infection 03/05/2023 1512 by Lorretta Rossetti, RN Outcome: Progressing 03/05/2023 1508 by Lorretta Rossetti, RN Outcome: Progressing Goal: Diagnostic test results will improve 03/05/2023 1512 by Lorretta Rossetti, RN Outcome: Progressing 03/05/2023 1508 by Lorretta Rossetti, RN Outcome: Progressing Goal: Respiratory complications will improve 03/05/2023 1512 by Lorretta Rossetti, RN Outcome: Progressing 03/05/2023 1508 by Lorretta Rossetti, RN Outcome: Progressing Goal: Cardiovascular complication will be avoided 03/05/2023 1512 by Lorretta Rossetti, RN Outcome: Progressing 03/05/2023 1508 by Lorretta Rossetti, RN Outcome: Progressing   Problem: Activity: Goal: Risk for activity intolerance will decrease 03/05/2023 1512 by Lorretta Rossetti, RN Outcome: Progressing 03/05/2023 1508 by Lorretta Rossetti, RN Outcome: Progressing   Problem: Nutrition: Goal: Adequate nutrition will be maintained 03/05/2023 1512 by Lorretta Rossetti, RN Outcome: Progressing 03/05/2023 1508 by Lorretta Rossetti, RN Outcome: Progressing   Problem: Coping: Goal: Level of anxiety will decrease 03/05/2023 1512 by Lorretta Rossetti, RN Outcome: Progressing 03/05/2023 1508 by Lorretta Rossetti, RN Outcome: Progressing   Problem: Elimination: Goal: Will not experience complications related to bowel motility 03/05/2023 1512 by Lorretta Rossetti, RN Outcome: Progressing 03/05/2023 1508 by Lorretta Rossetti, RN Outcome: Progressing Goal: Will not experience complications related to urinary retention 03/05/2023 1512 by Lorretta Rossetti, RN Outcome: Progressing 03/05/2023 1508 by Lorretta Rossetti, RN Outcome: Progressing   Problem: Pain Managment: Goal: General experience of comfort will improve and/or be controlled 03/05/2023 1512 by Lorretta Rossetti, RN Outcome: Progressing 03/05/2023 1508 by Lorretta Rossetti, RN Outcome: Progressing   Problem: Safety: Goal: Ability to remain free from injury will improve 03/05/2023 1512 by Lorretta Rossetti, RN Outcome: Progressing 03/05/2023 1508 by Lorretta Rossetti, RN Outcome: Progressing   Problem: Skin Integrity: Goal: Risk for impaired skin integrity will decrease 03/05/2023 1512 by Lorretta Rossetti, RN Outcome: Progressing 03/05/2023 1508 by Lorretta Rossetti, RN Outcome: Progressing   Problem: Education: Goal: Ability to describe self-care measures that may prevent or decrease complications (Diabetes Survival Skills Education) will improve 03/05/2023 1512 by Lorretta Rossetti, RN Outcome: Progressing 03/05/2023 1508 by Lorretta Rossetti, RN Outcome: Progressing Goal: Individualized Educational Video(s) 03/05/2023 1512 by Lorretta Rossetti, RN Outcome: Progressing 03/05/2023 1508 by Lorretta Rossetti, RN Outcome: Progressing   Problem: Coping: Goal: Ability to adjust to condition or change in health will improve 03/05/2023 1512 by Lorretta Rossetti, RN Outcome: Progressing 03/05/2023 1508 by Lorretta Rossetti, RN Outcome: Progressing   Problem: Fluid Volume: Goal: Ability to maintain a balanced intake and output will improve 03/05/2023 1512 by Lorretta Rossetti, RN Outcome: Progressing 03/05/2023 1508 by Lorretta Rossetti, RN Outcome: Progressing   Problem: Health  Behavior/Discharge Planning: Goal: Ability to identify and utilize available resources and services will improve 03/05/2023 1512 by Lorretta Rossetti, RN Outcome: Progressing 03/05/2023 1508 by Lorretta Rossetti, RN Outcome: Progressing Goal: Ability to manage health-related needs will improve 03/05/2023 1512 by Lorretta Rossetti, RN Outcome: Progressing 03/05/2023 1508 by Lorretta Rossetti, RN Outcome: Progressing   Problem: Metabolic: Goal: Ability to maintain appropriate glucose  levels will improve 03/05/2023 1512 by Lorretta Rossetti, RN Outcome: Progressing 03/05/2023 1508 by Lorretta Rossetti, RN Outcome: Progressing   Problem: Nutritional: Goal: Maintenance of adequate nutrition will improve 03/05/2023 1512 by Lorretta Rossetti, RN Outcome: Progressing 03/05/2023 1508 by Lorretta Rossetti, RN Outcome: Progressing Goal: Progress toward achieving an optimal weight will improve 03/05/2023 1512 by Lorretta Rossetti, RN Outcome: Progressing 03/05/2023 1508 by Lorretta Rossetti, RN Outcome: Progressing   Problem: Skin Integrity: Goal: Risk for impaired skin integrity will decrease 03/05/2023 1512 by Lorretta Rossetti, RN Outcome: Progressing 03/05/2023 1508 by Lorretta Rossetti, RN Outcome: Progressing   Problem: Tissue Perfusion: Goal: Adequacy of tissue perfusion will improve 03/05/2023 1512 by Lorretta Rossetti, RN Outcome: Progressing 03/05/2023 1508 by Lorretta Rossetti, RN Outcome: Progressing

## 2023-03-06 ENCOUNTER — Other Ambulatory Visit: Payer: Self-pay

## 2023-03-06 DIAGNOSIS — N179 Acute kidney failure, unspecified: Secondary | ICD-10-CM | POA: Diagnosis not present

## 2023-03-06 LAB — CBC
HCT: 29.6 % — ABNORMAL LOW (ref 39.0–52.0)
Hemoglobin: 9.5 g/dL — ABNORMAL LOW (ref 13.0–17.0)
MCH: 29.1 pg (ref 26.0–34.0)
MCHC: 32.1 g/dL (ref 30.0–36.0)
MCV: 90.5 fL (ref 80.0–100.0)
Platelets: 301 10*3/uL (ref 150–400)
RBC: 3.27 MIL/uL — ABNORMAL LOW (ref 4.22–5.81)
RDW: 13.5 % (ref 11.5–15.5)
WBC: 7 10*3/uL (ref 4.0–10.5)
nRBC: 0 % (ref 0.0–0.2)

## 2023-03-06 LAB — BASIC METABOLIC PANEL
Anion gap: 9 (ref 5–15)
BUN: 25 mg/dL — ABNORMAL HIGH (ref 8–23)
CO2: 16 mmol/L — ABNORMAL LOW (ref 22–32)
Calcium: 8 mg/dL — ABNORMAL LOW (ref 8.9–10.3)
Chloride: 110 mmol/L (ref 98–111)
Creatinine, Ser: 3.53 mg/dL — ABNORMAL HIGH (ref 0.61–1.24)
GFR, Estimated: 19 mL/min — ABNORMAL LOW (ref >60)
Glucose, Bld: 102 mg/dL — ABNORMAL HIGH (ref 70–99)
Potassium: 3.7 mmol/L (ref 3.5–5.1)
Sodium: 135 mmol/L (ref 135–145)

## 2023-03-06 LAB — HEPATITIS C ANTIBODY: HCV Ab: NONREACTIVE

## 2023-03-06 LAB — GLUCOSE, CAPILLARY: Glucose-Capillary: 100 mg/dL — ABNORMAL HIGH (ref 70–99)

## 2023-03-06 LAB — HEPATITIS B SURFACE ANTIGEN: Hepatitis B Surface Ag: NONREACTIVE

## 2023-03-06 LAB — HIV ANTIBODY (ROUTINE TESTING W REFLEX): HIV Screen 4th Generation wRfx: NONREACTIVE

## 2023-03-06 MED ORDER — LANCETS MISC. MISC: 1.0000 | Freq: Three times a day (TID) | 0 refills | Status: AC

## 2023-03-06 MED ORDER — LANCET DEVICE MISC: 1.0000 | Freq: Three times a day (TID) | 0 refills | Status: AC

## 2023-03-06 MED ORDER — BLOOD GLUCOSE TEST VI STRP: 1.0000 | ORAL_STRIP | Freq: Three times a day (TID) | 0 refills | Status: AC

## 2023-03-06 MED ORDER — LINEZOLID 600 MG PO TABS
600.0000 mg | ORAL_TABLET | Freq: Two times a day (BID) | ORAL | 0 refills | Status: AC
Start: 2023-03-06 — End: 2023-03-13

## 2023-03-06 MED ORDER — SODIUM BICARBONATE 650 MG PO TABS
650.0000 mg | ORAL_TABLET | Freq: Two times a day (BID) | ORAL | Status: DC
  Administered 2023-03-06: 650 mg via ORAL
  Filled 2023-03-06: qty 1

## 2023-03-06 MED ORDER — GLIPIZIDE 2.5 MG PO TABS: 1.0000 | ORAL_TABLET | Freq: Every day | ORAL | 0 refills | Status: DC

## 2023-03-06 MED ORDER — LEVOFLOXACIN 250 MG PO TABS: 250.0000 mg | ORAL_TABLET | Freq: Every day | ORAL | 0 refills | Status: AC

## 2023-03-06 MED ORDER — BLOOD GLUCOSE MONITORING SUPPL DEVI: 1.0000 | Freq: Three times a day (TID) | 0 refills | Status: AC

## 2023-03-06 MED ORDER — AMLODIPINE BESYLATE 5 MG PO TABS: 5.0000 mg | ORAL_TABLET | Freq: Every day | ORAL | 1 refills | Status: AC

## 2023-03-06 MED ORDER — SODIUM BICARBONATE 650 MG PO TABS: 650.0000 mg | ORAL_TABLET | Freq: Two times a day (BID) | ORAL | 0 refills | Status: DC

## 2023-03-06 NOTE — Plan of Care (Signed)
  Problem: Education: Goal: Knowledge of General Education information will improve Description: Including pain rating scale, medication(s)/side effects and non-pharmacologic comfort measures Outcome: Progressing   Problem: Health Behavior/Discharge Planning: Goal: Ability to manage health-related needs will improve Outcome: Progressing   Problem: Pain Managment: Goal: General experience of comfort will improve and/or be controlled Outcome: Progressing   Problem: Safety: Goal: Ability to remain free from injury will improve Outcome: Progressing

## 2023-03-06 NOTE — Progress Notes (Signed)
Joshua Galvan is an 63 y.o. male with HTN, HLD, DM diagnosed with influenza A p/w AMS, initially SOB + congestion. Head CT showed no acute intracranial process, fluid in the left mastoid air cells and middle ear, paranasal sinus disease, with air-fluid levels in the left sphenoid sinus and right maxillary sinus.  Patient's baseline creatinine is less than 1 but on presentation it was 3.39 with leukocytosis and anemia.  Blood pressure in the emergency department was 194/94, patient's blood pressure has dropped as low as 117 in the 122/72.  Assessment/Plan: Renal failure in the setting of influenza A, otomastoiditis.  May have been prerenal initially but likely has progressed to ATN in the setting of an infectious etiology, prerenal state, relative hypotension in the context of initial severe hypertension presentation in the emergency department with a blood pressure of 194/94.  UPC 8.6 but doubt active GN with inactive urine sediment; likely diabetic nephropathy.  Will check serologies and rule out paraprotein; renal ultrasound no obstruction but + e/o CKD.  Renal function slightly improved. -HCO3 1 tab BID -Stop fluids today and will follow; is taking p.o.  To be overloaded, trace edema lower extremity. -Encouraged patient to drink if he is thirsty; he is not uremic and although his appetite is not great he has no nausea. Good UOP.   -Monitor Daily I/Os, Daily weight  -Maintain MAP>65 for optimal renal perfusion.  - Avoid nephrotoxic agents such as IV contrast, NSAIDs, and phosphate containing bowel preps (FLEETS)   Influenza A -on Tamiflu but may be outside the window Hypertension - not easy but avoid overly rapid  drops in blood pressure to maintain adequate vital organ perfusion.(Goal no more than 30% drop for day to day); doing better now with less variation during the day Otomastoiditis -on Zyvox and Levaquin Anemia -transfuse as needed, even if he were iron deficient he is not a  candidate fo DM  Subjective: Denies fever chills, productive cough, dysuria, obstructive like symptoms.  Making a lot of urine overnight.  Appetite still not good but denies any nausea vomiting.   Chemistry and CBC: Creatinine  Date/Time Value Ref Range Status  09/19/2013 07:23 PM 0.87 0.60 - 1.30 mg/dL Final  16/10/9602 54:09 PM 0.84 0.60 - 1.30 mg/dL Final   Creatinine, Ser  Date/Time Value Ref Range Status  03/06/2023 04:37 AM 3.53 (H) 0.61 - 1.24 mg/dL Final  81/19/1478 29:56 AM 3.75 (H) 0.61 - 1.24 mg/dL Final  21/30/8657 84:69 AM 3.47 (H) 0.61 - 1.24 mg/dL Final  62/95/2841 32:44 AM 3.49 (H) 0.61 - 1.24 mg/dL Final  01/25/7251 66:44 AM 3.27 (H) 0.61 - 1.24 mg/dL Final  03/47/4259 56:38 PM 3.39 (H) 0.61 - 1.24 mg/dL Final   Recent Labs  Lab 03/01/23 1303 03/02/23 0423 03/03/23 0503 03/04/23 0450 03/05/23 0405 03/06/23 0437  NA 136 136 132* 139 136 135  K 4.2 4.1 3.9 3.8 3.8 3.7  CL 107 110 106 112* 110 110  CO2 19* 18* 18* 19* 19* 16*  GLUCOSE 187* 132* 361* 134* 226* 102*  BUN 42* 39* 39* 31* 29* 25*  CREATININE 3.39* 3.27* 3.49* 3.47* 3.75* 3.53*  CALCIUM 8.6* 8.1* 7.6* 8.2* 7.9* 8.0*  PHOS  --  3.0  --   --   --   --    Recent Labs  Lab 03/01/23 1303 03/02/23 0423 03/03/23 0503 03/04/23 0450 03/05/23 0405 03/06/23 0437  WBC 12.8*   < > 7.4 7.1 6.5 7.0  NEUTROABS 9.7*  --   --   --   --   --  HGB 10.8*   < > 8.9* 9.6* 9.2* 9.5*  HCT 32.0*   < > 26.6* 28.3* 27.8* 29.6*  MCV 87.9   < > 88.1 88.7 90.3 90.5  PLT 306   < > 292 357 308 301   < > = values in this interval not displayed.   Liver Function Tests: Recent Labs  Lab 03/01/23 1922 03/02/23 0423  AST 15  --   ALT 12  --   ALKPHOS 104  --   BILITOT 0.5  --   PROT 6.4*  --   ALBUMIN 2.4* 1.9*   No results for input(s): "LIPASE", "AMYLASE" in the last 168 hours. No results for input(s): "AMMONIA" in the last 168 hours. Cardiac Enzymes: No results for input(s): "CKTOTAL", "CKMB",  "CKMBINDEX", "TROPONINI" in the last 168 hours. Iron Studies: No results for input(s): "IRON", "TIBC", "TRANSFERRIN", "FERRITIN" in the last 72 hours. PT/INR: @LABRCNTIP (inr:5)  Xrays/Other Studies: ) Results for orders placed or performed during the hospital encounter of 03/01/23 (from the past 48 hours)  Glucose, capillary     Status: Abnormal   Collection Time: 03/04/23  7:42 AM  Result Value Ref Range   Glucose-Capillary 122 (H) 70 - 99 mg/dL    Comment: Glucose reference range applies only to samples taken after fasting for at least 8 hours.  Glucose, capillary     Status: Abnormal   Collection Time: 03/04/23 11:50 AM  Result Value Ref Range   Glucose-Capillary 141 (H) 70 - 99 mg/dL    Comment: Glucose reference range applies only to samples taken after fasting for at least 8 hours.  Glucose, capillary     Status: Abnormal   Collection Time: 03/04/23  5:09 PM  Result Value Ref Range   Glucose-Capillary 188 (H) 70 - 99 mg/dL    Comment: Glucose reference range applies only to samples taken after fasting for at least 8 hours.  Glucose, capillary     Status: Abnormal   Collection Time: 03/04/23  9:28 PM  Result Value Ref Range   Glucose-Capillary 150 (H) 70 - 99 mg/dL    Comment: Glucose reference range applies only to samples taken after fasting for at least 8 hours.  CBC     Status: Abnormal   Collection Time: 03/05/23  4:05 AM  Result Value Ref Range   WBC 6.5 4.0 - 10.5 K/uL   RBC 3.08 (L) 4.22 - 5.81 MIL/uL   Hemoglobin 9.2 (L) 13.0 - 17.0 g/dL   HCT 19.1 (L) 47.8 - 29.5 %   MCV 90.3 80.0 - 100.0 fL   MCH 29.9 26.0 - 34.0 pg   MCHC 33.1 30.0 - 36.0 g/dL   RDW 62.1 30.8 - 65.7 %   Platelets 308 150 - 400 K/uL   nRBC 0.0 0.0 - 0.2 %    Comment: Performed at [facility], [address], [place], [place] [address]  Basic metabolic panel     Status: Abnormal   Collection Time: 03/05/23  4:05 AM  Result Value Ref Range   Sodium 136 135 - 145 mmol/L    Potassium 3.8 3.5 - 5.1 mmol/L   Chloride 110 98 - 111 mmol/L   CO2 19 (L) 22 - 32 mmol/L   Glucose, Bld 226 (H) 70 - 99 mg/dL    Comment: Glucose reference range applies only to samples taken after fasting for at least 8 hours.   BUN 29 (H) 8 - 23 mg/dL   Creatinine, Ser 2.95 (H) 0.61 -  1.24 mg/dL   Calcium 7.9 (L) 8.9 - 10.3 mg/dL   GFR, Estimated 18 (L) >60 mL/min    Comment: (NOTE) Calculated using the CKD-EPI Creatinine Equation (2021)    Anion gap 7 5 - 15    Comment: Performed at [facility], [address], [place], [place] [address]  Glucose, capillary     Status: Abnormal   Collection Time: 03/05/23  7:28 AM  Result Value Ref Range   Glucose-Capillary 180 (H) 70 - 99 mg/dL    Comment: Glucose reference range applies only to samples taken after fasting for at least 8 hours.  Glucose, capillary     Status: Abnormal   Collection Time: 03/05/23 11:55 AM  Result Value Ref Range   Glucose-Capillary 143 (H) 70 - 99 mg/dL    Comment: Glucose reference range applies only to samples taken after fasting for at least 8 hours.  Protein / creatinine ratio, urine     Status: Abnormal   Collection Time: 03/05/23  3:22 PM  Result Value Ref Range   Creatinine, Urine 110 mg/dL   Total Protein, Urine 950 mg/dL    Comment: RESULT CONFIRMED BY MANUAL DILUTION NO NORMAL RANGE ESTABLISHED FOR THIS TEST    Protein Creatinine Ratio 8.64 (H) 0.00 - 0.15 mg/mg[Cre]    Comment: Performed at [facility], [address] [place], [place] [address]  Glucose, capillary     Status: Abnormal   Collection Time: 03/05/23  5:19 PM  Result Value Ref Range   Glucose-Capillary 205 (H) 70 - 99 mg/dL    Comment: Glucose reference range applies only to samples taken after fasting for at least 8 hours.  Glucose, capillary     Status: Abnormal   Collection Time: 03/05/23  8:42 PM  Result Value Ref Range   Glucose-Capillary 133 (H) 70 - 99 mg/dL    Comment: Glucose  reference range applies only to samples taken after fasting for at least 8 hours.   Comment 1 Notify RN   CBC     Status: Abnormal   Collection Time: 03/06/23  4:37 AM  Result Value Ref Range   WBC 7.0 4.0 - 10.5 K/uL   RBC 3.27 (L) 4.22 - 5.81 MIL/uL   Hemoglobin 9.5 (L) 13.0 - 17.0 g/dL   HCT 46.9 (L) 62.9 - 52.8 %   MCV 90.5 80.0 - 100.0 fL   MCH 29.1 26.0 - 34.0 pg   MCHC 32.1 30.0 - 36.0 g/dL   RDW 41.3 24.4 - 01.0 %   Platelets 301 150 - 400 K/uL   nRBC 0.0 0.0 - 0.2 %    Comment: Performed at [facility], [address], [place], [place] [address]  Basic metabolic panel     Status: Abnormal   Collection Time: 03/06/23  4:37 AM  Result Value Ref Range   Sodium 135 135 - 145 mmol/L   Potassium 3.7 3.5 - 5.1 mmol/L   Chloride 110 98 - 111 mmol/L   CO2 16 (L) 22 - 32 mmol/L   Glucose, Bld 102 (H) 70 - 99 mg/dL    Comment: Glucose reference range applies only to samples taken after fasting for at least 8 hours.   BUN 25 (H) 8 - 23 mg/dL   Creatinine, Ser 6.64 (H) 0.61 - 1.24 mg/dL   Calcium 8.0 (L) 8.9 - 10.3 mg/dL   GFR, Estimated 19 (L) >60 mL/min    Comment: (NOTE) Calculated using the CKD-EPI Creatinine Equation (  2021)    Anion gap 9 5 - 15    Comment: Performed at [facility], [address] [place], [place] [address]   No results found.  PMH:   Past Medical History:  Diagnosis Date   Diabetes mellitus without complication (HCC)    Hyperlipidemia    Hypertension     PSH:  History reviewed. No pertinent surgical history.  Allergies: No Known Allergies  Medications:   Prior to Admission medications   Medication Sig Start Date End Date Taking? Authorizing Provider  acetaminophen (TYLENOL) 500 MG tablet Take 500-1,500 mg by mouth every 6 (six) hours as needed for mild pain (pain score 1-3) or moderate pain (pain score 4-6).   Yes [provider]  butalbital-acetaminophen-caffeine (FIORICET) 50-325-40 MG tablet  Take 1-2 tablets by mouth every 6 (six) hours as needed for headache. 02/27/23 02/27/24 Yes Jene Every, MD  losartan-hydrochlorothiazide (HYZAAR) 100-25 MG tablet Take 1 tablet by mouth in the morning.   Yes [provider]  ibuprofen (ADVIL) 600 MG tablet Take 1 tablet (600 mg total) by mouth every 6 (six) hours as needed. Patient not taking: Reported on 03/01/2023 01/05/21   Dionne Bucy, MD    Discontinued Meds:   Medications Discontinued During This Encounter  Medication Reason   acetaminophen (TYLENOL) tablet 1,000 mg    cefTRIAXone (ROCEPHIN) 1 g in sodium chloride 0.9 % 100 mL IVPB    0.9 %  sodium chloride infusion    metroNIDAZOLE (FLAGYL) IVPB 500 mg    ceFEPIme (MAXIPIME) 2 g in sodium chloride 0.9 % 100 mL IVPB    oseltamivir (TAMIFLU) capsule 30 mg    feeding supplement (NEPRO CARB STEADY) liquid 237 mL    piperacillin-tazobactam (ZOSYN) IVPB 3.375 g    linezolid (ZYVOX) IVPB 600 mg P&T Policy: IV to PO Conversion    Social History:  reports that he has never smoked. He has never used smokeless tobacco. He reports current alcohol use. He reports that he does not use drugs.  Family History:  History reviewed. No pertinent family history.  Blood pressure (!) 169/81, pulse 85, temperature 97.7 F (36.5 C), temperature source Oral, resp. rate 18, height 5\' 11"  (1.803 m), weight 88.1 kg, SpO2 100%. Physical Exam: GEN: NAD, A&Ox3, NCAT HEENT: No conjunctival pallor, EOMI NECK: Supple, no thyromegaly LUNGS: CTA B/L no rales, rhonchi or wheezing CV: RRR, No M/R/G ABD: SNDNT +BS  EXT: tr-1+ lower extremity edema      Myleigh Amara, Len Blalock, MD 03/06/2023, 7:26 AM

## 2023-03-06 NOTE — Progress Notes (Signed)
PROGRESS NOTE    Gillie Crisci  ZOX:[id number] DOB: [birth year] DOA: 03/01/2023 PCP: System, Provider Not In   Brief Narrative: 63 year old with past medical history significant for essential hypertension, hyperlipidemia, diabetes type 2 recently diagnosed with influenza A has been feeling weak for the last week, reports poor oral intake.  Patient admitted with AKI, creatinine 3.3, previous creatinine available 2 years ago was 1.2.    Assessment & Plan:   Principal Problem:   AKI (acute kidney injury) (HCC) Active Problems:   Chronic kidney disease, stage 3a (HCC)   Diabetes mellitus type 2 with complications (HCC)   Essential hypertension   Hyperlipidemia  1-AKI: -In the setting of hypovolemia, poor oral intake, diuretics ARB, and ATN -Present with a creatinine of 3.3.  Previous creatinine 2 years ago 1.2 -Strict I's and O's -Renal US: Increased echogenicity of renal parenchyma is noted bilaterally suggesting medical renal disease. No hydronephrosis or renal obstruction is noted. -Continue with IV fluids.  No improvement. Nephrology consulted.    Influenza A: -Symptoms started 4 days ago, now he is requiring hospitalization will start Tamiflu.  Improving.   Severe headache: -suspect related to sinusitis.  -MRI brain, Orbit negative -Resume Fioricet.  Headaches improved.   Diabetes type II -Does not appear to be of medication as an outpatient -A1c 7.6 - sliding scale insulin -Started  long acting CBG 300\while in the hospital.   Hypertension: -Continue to hold losartan and hydrochlorothiazide. Started  Norvas.  PRN Hydralazine   Leukocytosis: Otomastoiditis, maxillary sinusitis sphenoid sinusitis, chronic left maxillary sinusitis -Continue with  IV linezild.  -Orbital MRI and brain MRI due to severe Headaches. Negative Change ZOsyn to Levaquin to avoid affection of renal function from Zosyn       Estimated body mass index is 27.09 kg/m as  calculated from the following:   Height as of this encounter: 5\' 11"  (1.803 m).   Weight as of this encounter: 88.1 kg.   DVT prophylaxis: Heparin  Code Status: Full code Family Communication: Wife at bedside Disposition Plan:  Status is: Inpatient Remains inpatient appropriate because: management of AKI    Consultants:  none  Procedures:  none  Antimicrobials:    Subjective: Headaches better, appetitive improved today   Objective: Vitals:   03/05/23 1154 03/05/23 1934 03/06/23 0338 03/06/23 0610  BP: (!) 106/50 (!) 152/80 (!) 169/81   Pulse: 95 86 85   Resp: 16 20 18    Temp: 97.8 F (36.6 C) 97.7 F (36.5 C) 97.7 F (36.5 C)   TempSrc: Oral  Oral   SpO2: 99% 99% 100%   Weight:    88.1 kg  Height:        Intake/Output Summary (Last 24 hours) at 03/06/2023 8119 Last data filed at 03/06/2023 0600 Gross per 24 hour  Intake 2328.53 ml  Output 1750 ml  Net 578.53 ml   Filed Weights   03/04/23 0441 03/05/23 0712 03/06/23 0610  Weight: 81.9 kg 83.9 kg 88.1 kg    Examination:  General exam: NAD Respiratory system: CTA Cardiovascular system: S 1, S 2 RRR  Gastrointestinal system: BS present, soft, nt Extremities: no edema    Data Reviewed: I have personally reviewed following labs and imaging studies  CBC: Recent Labs  Lab 03/01/23 1303 03/02/23 0423 03/03/23 0503 03/04/23 0450 03/05/23 0405 03/06/23 0437  WBC 12.8* 8.8 7.4 7.1 6.5 7.0  NEUTROABS 9.7*  --   --   --   --   --   HGB 10.8*  9.4* 8.9* 9.6* 9.2* 9.5*  HCT 32.0* 27.9* 26.6* 28.3* 27.8* 29.6*  MCV 87.9 88.3 88.1 88.7 90.3 90.5  PLT 306 279 292 357 308 301   Basic Metabolic Panel: Recent Labs  Lab 03/02/23 0423 03/03/23 0503 03/04/23 0450 03/05/23 0405 03/06/23 0437  NA 136 132* 139 136 135  K 4.1 3.9 3.8 3.8 3.7  CL 110 106 112* 110 110  CO2 18* 18* 19* 19* 16*  GLUCOSE 132* 361* 134* 226* 102*  BUN 39* 39* 31* 29* 25*  CREATININE 3.27* 3.49* 3.47* 3.75* 3.53*  CALCIUM 8.1*  7.6* 8.2* 7.9* 8.0*  PHOS 3.0  --   --   --   --    GFR: Estimated Creatinine Clearance: 23.4 mL/min (A) (by C-G formula based on SCr of 3.53 mg/dL (H)). Liver Function Tests: Recent Labs  Lab 03/01/23 1922 03/02/23 0423  AST 15  --   ALT 12  --   ALKPHOS 104  --   BILITOT 0.5  --   PROT 6.4*  --   ALBUMIN 2.4* 1.9*   No results for input(s): "LIPASE", "AMYLASE" in the last 168 hours. No results for input(s): "AMMONIA" in the last 168 hours. Coagulation Profile: No results for input(s): "INR", "PROTIME" in the last 168 hours. Cardiac Enzymes: No results for input(s): "CKTOTAL", "CKMB", "CKMBINDEX", "TROPONINI" in the last 168 hours. BNP (last 3 results) No results for input(s): "PROBNP" in the last 8760 hours. HbA1C: No results for input(s): "HGBA1C" in the last 72 hours.  CBG: Recent Labs  Lab 03/04/23 2128 03/05/23 0728 03/05/23 1155 03/05/23 1719 03/05/23 2042  GLUCAP 150* 180* 143* 205* 133*   Lipid Profile: No results for input(s): "CHOL", "HDL", "LDLCALC", "TRIG", "CHOLHDL", "LDLDIRECT" in the last 72 hours. Thyroid Function Tests: No results for input(s): "TSH", "T4TOTAL", "FREET4", "T3FREE", "THYROIDAB" in the last 72 hours. Anemia Panel: No results for input(s): "VITAMINB12", "FOLATE", "FERRITIN", "TIBC", "IRON", "RETICCTPCT" in the last 72 hours. Sepsis Labs: No results for input(s): "PROCALCITON", "LATICACIDVEN" in the last 168 hours.  Recent Results (from the past 240 hours)  Resp panel by RT-PCR (RSV, Flu A&B, Covid) Anterior Nasal Swab     Status: Abnormal   Collection Time: 02/26/23 11:46 PM   Specimen: Anterior Nasal Swab  Result Value Ref Range Status   SARS Coronavirus 2 by RT PCR NEGATIVE NEGATIVE Final    Comment: (NOTE) SARS-CoV-2 target nucleic acids are NOT DETECTED.  The SARS-CoV-2 RNA is generally detectable in upper respiratory specimens during the acute phase of infection. The lowest concentration of SARS-CoV-2 viral copies this  assay can detect is 138 copies/mL. A negative result does not preclude SARS-Cov-2 infection and should not be used as the sole basis for treatment or other patient management decisions. A negative result may occur with  improper specimen collection/handling, submission of specimen other than nasopharyngeal swab, presence of viral mutation(s) within the areas targeted by this assay, and inadequate number of viral copies(<138 copies/mL). A negative result must be combined with clinical observations, patient history, and epidemiological information. The expected result is Negative.  Fact Sheet for Patients:  BloggerCourse.com  Fact Sheet for Healthcare Providers:  SeriousBroker.it  This test is no t yet approved or cleared by the [place] FDA and  has been authorized for detection and/or diagnosis of SARS-CoV-2 by FDA under an Emergency Use Authorization (EUA). This EUA will remain  in effect (meaning this test can be used) for the duration of the COVID-19 declaration under Section 564(b)(1) of  the Act, 21 U.S.C.section 360bbb-3(b)(1), unless the authorization is terminated  or revoked sooner.       Influenza A by PCR POSITIVE (A) NEGATIVE Final   Influenza B by PCR NEGATIVE NEGATIVE Final    Comment: (NOTE) The Xpert Xpress SARS-CoV-2/FLU/RSV plus assay is intended as an aid in the diagnosis of influenza from Nasopharyngeal swab specimens and should not be used as a sole basis for treatment. Nasal washings and aspirates are unacceptable for Xpert Xpress SARS-CoV-2/FLU/RSV testing.  Fact Sheet for Patients: BloggerCourse.com  Fact Sheet for Healthcare Providers: SeriousBroker.it  This test is not yet approved or cleared by the [place] FDA and has been authorized for detection and/or diagnosis of SARS-CoV-2 by FDA under an Emergency Use Authorization (EUA). This EUA  will remain in effect (meaning this test can be used) for the duration of the COVID-19 declaration under Section 564(b)(1) of the Act, 21 U.S.C. section 360bbb-3(b)(1), unless the authorization is terminated or revoked.     Resp Syncytial Virus by PCR NEGATIVE NEGATIVE Final    Comment: (NOTE) Fact Sheet for Patients: BloggerCourse.com  Fact Sheet for Healthcare Providers: SeriousBroker.it  This test is not yet approved or cleared by the [place] FDA and has been authorized for detection and/or diagnosis of SARS-CoV-2 by FDA under an Emergency Use Authorization (EUA). This EUA will remain in effect (meaning this test can be used) for the duration of the COVID-19 declaration under Section 564(b)(1) of the Act, 21 U.S.C. section 360bbb-3(b)(1), unless the authorization is terminated or revoked.  Performed at [facility], [address] [place], [place] [address]   Group A Strep by PCR [facility] Only)     Status: None   Collection Time: 02/26/23 11:46 PM   Specimen: Throat; Sterile Swab  Result Value Ref Range Status   Group A Strep by PCR NOT DETECTED NOT DETECTED Final    Comment: Performed at [facility], [address] [place], [place] [address]         Radiology Studies: No results found.       Scheduled Meds:  amLODipine  5 mg Oral Daily   feeding supplement (NEPRO CARB STEADY)  237 mL Oral TID BM   fluticasone  1 spray Each Nare Daily   heparin  5,000 Units Subcutaneous Q8H   insulin aspart  0-15 Units Subcutaneous TID WC   insulin aspart  0-5 Units Subcutaneous QHS   insulin glargine-yfgn  10 Units Subcutaneous Daily   levofloxacin  250 mg Oral Daily   linezolid  600 mg Oral Q12H   oseltamivir  30 mg Oral Daily   sodium bicarbonate  650 mg Oral BID   Continuous Infusions:  sodium chloride 100 mL/hr at 03/06/23 0533     LOS: 5 days    Time spent: 35 minutes    Maryela Tapper A  Laramie Gelles, MD [facility]   If 7PM-7AM, please contact night-coverage [email or web address]  03/06/2023, 7:52 AM

## 2023-03-06 NOTE — Discharge Summary (Signed)
Physician Discharge Summary   Patient: Joshua Galvan MRN: [id number] DOB: 29-Nov-1960  Admit date:     03/01/2023  Discharge date: 03/06/23  Discharge Physician: Alba Cory   PCP: System, Provider Not In   Recommendations at discharge:   Needs close follow up with Nephrology, in 2 weeks. Discussed with DR LIn Follow resolution otomastoiditis.   Discharge Diagnoses: Principal Problem:   AKI (acute kidney injury) (HCC) Active Problems:   Chronic kidney disease, stage 3a (HCC)   Diabetes mellitus type 2 with complications (HCC)   Essential hypertension   Hyperlipidemia  Resolved Problems:   * No resolved hospital problems. *  Hospital Course: 63 year old with past medical history significant for essential hypertension, hyperlipidemia, diabetes type 2 recently diagnosed with influenza A has been feeling weak for the last week, reports poor oral intake.   Patient admitted with AKI, creatinine 3.3, previous creatinine available 2 years ago was 1.2.    Assessment and Plan: 1-AKI: -In the setting of hypovolemia, poor oral intake, diuretics ARB, and ATN -Present with a creatinine of 3.3.  Previous creatinine 2 years ago 1.2 -Strict I's and O's -Renal US: Increased echogenicity of renal parenchyma is noted bilaterally suggesting medical renal disease. No hydronephrosis or renal obstruction is noted. -Continue with IV fluids.  Appreciate Nephrology assistance. Cr down to 3.5 Discussed with Nephrology, ok to discharge patient today, FU with nephrology in 2 weeks.   Influenza A: -Symptoms started 4 days ago, now he is requiring hospitalization will start Tamiflu.  Improving.    Severe headache: -suspect related to sinusitis.  -MRI brain, Orbit negative -Resume Fioricet.  Headaches improved.    Diabetes type II -Does not appear to be of medication as an outpatient -A1c 7.6 - sliding scale insulin -Started  long acting CBG 300\while in the hospital.    resume lower dose glipizide at discharge  Hypertension: -Continue to hold losartan and hydrochlorothiazide. Started  Norvas.  PRN Hydralazine    Leukocytosis: Otomastoiditis, maxillary sinusitis sphenoid sinusitis, chronic left maxillary sinusitis -Continue with  IV linezild.  -Orbital MRI and brain MRI due to severe Headaches. Negative Change Zosyn to Levaquin to avoid affection of renal function from Zosyn   Discharge on Levaquin and linezolid for 7 days.           Consultants: Nephrology  Procedures performed: Renal US Disposition: Home Diet recommendation:  Discharge Diet Orders (From admission, onward)     Start     Ordered   03/06/23 0000  Diet Carb Modified        03/06/23 1131           Carb modified diet DISCHARGE MEDICATION: Allergies as of 03/06/2023   No Known Allergies      Medication List     STOP taking these medications    ibuprofen 600 MG tablet Commonly known as: ADVIL   losartan-hydrochlorothiazide 100-25 MG tablet Commonly known as: HYZAAR       TAKE these medications    acetaminophen 500 MG tablet Commonly known as: TYLENOL Take 500-1,500 mg by mouth every 6 (six) hours as needed for mild pain (pain score 1-3) or moderate pain (pain score 4-6).   amLODipine 5 MG tablet Commonly known as: NORVASC Take 1 tablet (5 mg total) by mouth daily. Start taking on: March 07, 2023   Blood Glucose Monitoring Suppl Devi 1 each by Does not apply route in the morning, at noon, and at bedtime. May substitute to any manufacturer covered by patient's insurance.  BLOOD GLUCOSE TEST STRIPS Strp 1 each by In Vitro route in the morning, at noon, and at bedtime. May substitute to any manufacturer covered by patient's insurance.   butalbital-acetaminophen-caffeine 50-325-40 MG tablet Commonly known as: FIORICET Take 1-2 tablets by mouth every 6 (six) hours as needed for headache.   glipiZIDE 2.5 MG Tabs Take 1 tablet by mouth daily at 12  noon.   Lancet Device Misc 1 each by Does not apply route in the morning, at noon, and at bedtime. May substitute to any manufacturer covered by patient's insurance.   Lancets Misc. Misc 1 each by Does not apply route in the morning, at noon, and at bedtime. May substitute to any manufacturer covered by patient's insurance.   levofloxacin 250 MG tablet Commonly known as: LEVAQUIN Take 1 tablet (250 mg total) by mouth daily for 7 days. Start taking on: March 07, 2023   linezolid 600 MG tablet Commonly known as: ZYVOX Take 1 tablet (600 mg total) by mouth every 12 (twelve) hours for 7 days.   sodium bicarbonate 650 MG tablet Take 1 tablet (650 mg total) by mouth 2 (two) times daily.        Follow-up Information     Ethelene Hal, MD Follow up in 1 week(s).   Specialties: Nephrology, Vascular Surgery Why: llame pare poner [facility]. Contact information: [address] [place] [address] [phone]                Discharge Exam: Filed Weights   03/04/23 0441 03/05/23 0712 03/06/23 0610  Weight: 81.9 kg 83.9 kg 88.1 kg   General NAD  Condition at discharge: stable  The results of significant diagnostics from this hospitalization (including imaging, microbiology, ancillary and laboratory) are listed below for reference.   Imaging Studies: MR BRAIN WO CONTRAST Result Date: 03/02/2023 CLINICAL DATA:  Uveitis or scleritis suspected, headache EXAM: MRI HEAD AND ORBITS WITHOUT CONTRAST TECHNIQUE: Multiplanar, multi-echo pulse sequences of the brain and surrounding structures were acquired without intravenous contrast. Multiplanar, multi-echo pulse sequences of the orbits and surrounding structures were acquired including fat saturation techniques, without intravenous contrast administration. COMPARISON:  No prior MRI available, correlation is made with CT head 03/01/2023 FINDINGS: MRI HEAD FINDINGS Brain: No restricted diffusion to suggest acute or subacute infarct. No  acute hemorrhage, mass, mass effect, or midline shift. No hydrocephalus or extra-axial collection. Scattered T2 hyperintense signal in the periventricular white matter, likely the sequela of chronic small vessel ischemic disease. Pituitary and craniocervical junction are within normal limits. Punctate foci of hemosiderin deposition in the periventricular white matter, likely sequela of prior chronic hypertensive microhemorrhages. No evidence remote cortical or significant lacunar infarct. Vascular: Normal arterial flow voids. Skull and upper cervical spine: Normal marrow signal. Other: Fluid in the bilateral mastoid air cells and left middle ear. MRI ORBITS FINDINGS Evaluation is somewhat limited by the absence of intravenous contrast. Orbits: No orbital mass or evidence of inflammation. Normal appearance of the globes, optic nerve-sheath complexes, extraocular muscles, orbital fat and lacrimal glands. Visualized sinuses: Mucosal thickening in the left greater than right maxillary sinus, sphenoid sinus, and ethmoid air cells. Soft tissues: Normal. IMPRESSION: 1. No acute intracranial process. No evidence of acute or subacute infarct. 2. No acute process in the orbits. 3. Fluid in the left mastoid air cells and middle ear, which is nonspecific but can be seen in the setting of otomastoiditis. 4. Left-greater-than-right paranasal sinus disease, as can be seen in the setting of acute sinusitis. Correlate with  symptoms. Electronically Signed   By: Wiliam Ke M.D.   On: 03/02/2023 17:49   MR ORBITS WO CONTRAST Result Date: 03/02/2023 CLINICAL DATA:  Uveitis or scleritis suspected, headache EXAM: MRI HEAD AND ORBITS WITHOUT CONTRAST TECHNIQUE: Multiplanar, multi-echo pulse sequences of the brain and surrounding structures were acquired without intravenous contrast. Multiplanar, multi-echo pulse sequences of the orbits and surrounding structures were acquired including fat saturation techniques, without intravenous  contrast administration. COMPARISON:  No prior MRI available, correlation is made with CT head 03/01/2023 FINDINGS: MRI HEAD FINDINGS Brain: No restricted diffusion to suggest acute or subacute infarct. No acute hemorrhage, mass, mass effect, or midline shift. No hydrocephalus or extra-axial collection. Scattered T2 hyperintense signal in the periventricular white matter, likely the sequela of chronic small vessel ischemic disease. Pituitary and craniocervical junction are within normal limits. Punctate foci of hemosiderin deposition in the periventricular white matter, likely sequela of prior chronic hypertensive microhemorrhages. No evidence remote cortical or significant lacunar infarct. Vascular: Normal arterial flow voids. Skull and upper cervical spine: Normal marrow signal. Other: Fluid in the bilateral mastoid air cells and left middle ear. MRI ORBITS FINDINGS Evaluation is somewhat limited by the absence of intravenous contrast. Orbits: No orbital mass or evidence of inflammation. Normal appearance of the globes, optic nerve-sheath complexes, extraocular muscles, orbital fat and lacrimal glands. Visualized sinuses: Mucosal thickening in the left greater than right maxillary sinus, sphenoid sinus, and ethmoid air cells. Soft tissues: Normal. IMPRESSION: 1. No acute intracranial process. No evidence of acute or subacute infarct. 2. No acute process in the orbits. 3. Fluid in the left mastoid air cells and middle ear, which is nonspecific but can be seen in the setting of otomastoiditis. 4. Left-greater-than-right paranasal sinus disease, as can be seen in the setting of acute sinusitis. Correlate with symptoms. Electronically Signed   By: Wiliam Ke M.D.   On: 03/02/2023 17:49   US Renal Result Date: 03/01/2023 CLINICAL DATA:  Acute kidney injury. EXAM: RENAL / URINARY TRACT ULTRASOUND COMPLETE COMPARISON:  None Available. FINDINGS: Right Kidney: Renal measurements: 10.4 x 6.2 x 4.9 cm = volume: 165 mL.  Mildly increased echogenicity of renal parenchyma is noted. 1.8 cm simple cyst is noted. No mass or hydronephrosis visualized. Left Kidney: Renal measurements: 9.7 x 5.3 x 4.0 cm = volume: 108 mL. Increased echogenicity of renal parenchyma is noted. No mass or hydronephrosis visualized. Bladder: Appears normal for degree of bladder distention. Other: None. IMPRESSION: Increased echogenicity of renal parenchyma is noted bilaterally suggesting medical renal disease. No hydronephrosis or renal obstruction is noted. Electronically Signed   By: Lupita Raider M.D.   On: 03/01/2023 19:22   CT Head Wo Contrast Result Date: 03/01/2023 CLINICAL DATA:  Hypertension, headache EXAM: CT HEAD WITHOUT CONTRAST TECHNIQUE: Contiguous axial images were obtained from the base of the skull through the vertex without intravenous contrast. RADIATION DOSE REDUCTION: This exam was performed according to the departmental dose-optimization program which includes automated exposure control, adjustment of the mA and/or kV according to patient size and/or use of iterative reconstruction technique. COMPARISON:  None Available. FINDINGS: Brain: No evidence of acute infarction, hemorrhage, mass, mass effect, or midline shift. No hydrocephalus or extra-axial fluid collection. Vascular: No hyperdense vessel. Skull: Negative for fracture or focal lesion. Sinuses/Orbits: Near complete opacification of the left maxillary sinus, with osseous thickening of the left maxillary sinus walls. Opacification of the left greater than right ethmoid air cells and left greater than right sphenoid sinus, with air-fluid levels in  the left sphenoid sinus, and right maxillary sinus. No acute finding in the orbits. Other: Fluid in the left mastoid air cells and middle ear. Trace fluid in the right mastoid air cells. IMPRESSION: 1. No acute intracranial process. 2. Fluid in the left mastoid air cells and middle ear, which could represent otomastoiditis. 3. Paranasal  sinus disease, with air-fluid levels in the left sphenoid sinus and right maxillary sinus, which can be seen in the setting of acute sinusitis. Osseous thickening of the left maxillary sinus suggests underlying chronic left maxillary sinusitis. Electronically Signed   By: Wiliam Ke M.D.   On: 03/01/2023 13:58   DG Chest 2 View Result Date: 02/27/2023 CLINICAL DATA:  Cough, congestion, and sore throat for 8 days. EXAM: CHEST - 2 VIEW COMPARISON:  CT chest 08/29/2008 FINDINGS: Shallow inspiration. Heart size and pulmonary vascularity are normal. Lungs are clear. No pleural effusion or pneumothorax. Mediastinal contours appear intact. Degenerative changes in the spine. IMPRESSION: No active cardiopulmonary disease. Electronically Signed   By: Burman Nieves M.D.   On: 02/27/2023 00:07    Microbiology: Results for orders placed or performed during the hospital encounter of 02/27/23  Resp panel by RT-PCR (RSV, Flu A&B, Covid) Anterior Nasal Swab     Status: Abnormal   Collection Time: 02/26/23 11:46 PM   Specimen: Anterior Nasal Swab  Result Value Ref Range Status   SARS Coronavirus 2 by RT PCR NEGATIVE NEGATIVE Final    Comment: (NOTE) SARS-CoV-2 target nucleic acids are NOT DETECTED.  The SARS-CoV-2 RNA is generally detectable in upper respiratory specimens during the acute phase of infection. The lowest concentration of SARS-CoV-2 viral copies this assay can detect is 138 copies/mL. A negative result does not preclude SARS-Cov-2 infection and should not be used as the sole basis for treatment or other patient management decisions. A negative result may occur with  improper specimen collection/handling, submission of specimen other than nasopharyngeal swab, presence of viral mutation(s) within the areas targeted by this assay, and inadequate number of viral copies(<138 copies/mL). A negative result must be combined with clinical observations, patient history, and  epidemiological information. The expected result is Negative.  Fact Sheet for Patients:  BloggerCourse.com  Fact Sheet for Healthcare Providers:  SeriousBroker.it  This test is no t yet approved or cleared by the [place] FDA and  has been authorized for detection and/or diagnosis of SARS-CoV-2 by FDA under an Emergency Use Authorization (EUA). This EUA will remain  in effect (meaning this test can be used) for the duration of the COVID-19 declaration under Section 564(b)(1) of the Act, 21 U.S.C.section 360bbb-3(b)(1), unless the authorization is terminated  or revoked sooner.       Influenza A by PCR POSITIVE (A) NEGATIVE Final   Influenza B by PCR NEGATIVE NEGATIVE Final    Comment: (NOTE) The Xpert Xpress SARS-CoV-2/FLU/RSV plus assay is intended as an aid in the diagnosis of influenza from Nasopharyngeal swab specimens and should not be used as a sole basis for treatment. Nasal washings and aspirates are unacceptable for Xpert Xpress SARS-CoV-2/FLU/RSV testing.  Fact Sheet for Patients: BloggerCourse.com  Fact Sheet for Healthcare Providers: SeriousBroker.it  This test is not yet approved or cleared by the [place] FDA and has been authorized for detection and/or diagnosis of SARS-CoV-2 by FDA under an Emergency Use Authorization (EUA). This EUA will remain in effect (meaning this test can be used) for the duration of the COVID-19 declaration under Section 564(b)(1) of the Act, 21 U.S.C. section  360bbb-3(b)(1), unless the authorization is terminated or revoked.     Resp Syncytial Virus by PCR NEGATIVE NEGATIVE Final    Comment: (NOTE) Fact Sheet for Patients: BloggerCourse.com  Fact Sheet for Healthcare Providers: SeriousBroker.it  This test is not yet approved or cleared by the [place] FDA and has  been authorized for detection and/or diagnosis of SARS-CoV-2 by FDA under an Emergency Use Authorization (EUA). This EUA will remain in effect (meaning this test can be used) for the duration of the COVID-19 declaration under Section 564(b)(1) of the Act, 21 U.S.C. section 360bbb-3(b)(1), unless the authorization is terminated or revoked.  Performed at [facility], [address] [place], [place] [address]   Group A Strep by PCR [facility] Only)     Status: None   Collection Time: 02/26/23 11:46 PM   Specimen: Throat; Sterile Swab  Result Value Ref Range Status   Group A Strep by PCR NOT DETECTED NOT DETECTED Final    Comment: Performed at [facility], [address] [place], [place] [address]    Labs: CBC: Recent Labs  Lab 03/01/23 1303 03/02/23 0423 03/03/23 0503 03/04/23 0450 03/05/23 0405 03/06/23 0437  WBC 12.8* 8.8 7.4 7.1 6.5 7.0  NEUTROABS 9.7*  --   --   --   --   --   HGB 10.8* 9.4* 8.9* 9.6* 9.2* 9.5*  HCT 32.0* 27.9* 26.6* 28.3* 27.8* 29.6*  MCV 87.9 88.3 88.1 88.7 90.3 90.5  PLT 306 279 292 357 308 301   Basic Metabolic Panel: Recent Labs  Lab 03/02/23 0423 03/03/23 0503 03/04/23 0450 03/05/23 0405 03/06/23 0437  NA 136 132* 139 136 135  K 4.1 3.9 3.8 3.8 3.7  CL 110 106 112* 110 110  CO2 18* 18* 19* 19* 16*  GLUCOSE 132* 361* 134* 226* 102*  BUN 39* 39* 31* 29* 25*  CREATININE 3.27* 3.49* 3.47* 3.75* 3.53*  CALCIUM 8.1* 7.6* 8.2* 7.9* 8.0*  PHOS 3.0  --   --   --   --    Liver Function Tests: Recent Labs  Lab 03/01/23 1922 03/02/23 0423  AST 15  --   ALT 12  --   ALKPHOS 104  --   BILITOT 0.5  --   PROT 6.4*  --   ALBUMIN 2.4* 1.9*   CBG: Recent Labs  Lab 03/05/23 0728 03/05/23 1155 03/05/23 1719 03/05/23 2042 03/06/23 0832  GLUCAP 180* 143* 205* 133* 100*    Discharge time spent: greater than 30 minutes.  Signed: Alba Cory, MD [facility] 03/06/2023

## 2023-03-07 LAB — ANCA PROFILE
Anti-MPO Antibodies: <0.2 U (ref 0.0–0.9)
Anti-PR3 Antibodies: <0.2 U (ref 0.0–0.9)
Atypical P-ANCA titer: <1:20 {titer}
C-ANCA: <1:20 {titer}
P-ANCA: <1:20 {titer}

## 2023-03-07 LAB — C3 COMPLEMENT: C3 Complement: 121 mg/dL (ref 82–167)

## 2023-03-07 LAB — HEPATITIS B CORE ANTIBODY, TOTAL: HEP B CORE AB: NEGATIVE

## 2023-03-07 LAB — C4 COMPLEMENT: Complement C4, Body Fluid: 28 mg/dL (ref 12–38)

## 2023-03-07 LAB — KAPPA/LAMBDA LIGHT CHAINS
Kappa free light chain: 98.3 mg/L — ABNORMAL HIGH (ref 3.3–19.4)
Kappa, lambda light chain ratio: 1.74 — ABNORMAL HIGH (ref 0.26–1.65)
Lambda free light chains: 56.6 mg/L — ABNORMAL HIGH (ref 5.7–26.3)

## 2023-03-07 LAB — ANTI-DNA ANTIBODY, DOUBLE-STRANDED: ds DNA Ab: 11 [IU]/mL — ABNORMAL HIGH (ref 0–9)

## 2023-03-07 LAB — GLOMERULAR BASEMENT MEMBRANE ANTIBODIES: GBM Ab: <0.2 U (ref 0.0–0.9)

## 2023-03-08 LAB — PROTEIN ELECTROPHORESIS, SERUM
A/G Ratio: 0.8 (ref 0.7–1.7)
Albumin ELP: 2.3 g/dL — ABNORMAL LOW (ref 2.9–4.4)
Alpha-1-Globulin: 0.3 g/dL (ref 0.0–0.4)
Alpha-2-Globulin: 1 g/dL (ref 0.4–1.0)
Beta Globulin: 1.2 g/dL (ref 0.7–1.3)
Gamma Globulin: 0.6 g/dL (ref 0.4–1.8)
Globulin, Total: 3 g/dL (ref 2.2–3.9)
Total Protein ELP: 5.3 g/dL — ABNORMAL LOW (ref 6.0–8.5)

## 2023-03-09 LAB — ENA+DNA/DS+ANTICH+CENTRO+JO...
Anti JO-1: <0.2 AI (ref 0.0–0.9)
Centromere Ab Screen: <0.2 AI (ref 0.0–0.9)
Chromatin Ab SerPl-aCnc: <0.2 AI (ref 0.0–0.9)
ENA SM Ab Ser-aCnc: <0.2 AI (ref 0.0–0.9)
Ribonucleic Protein: 0.2 AI (ref 0.0–0.9)
SSA (Ro) (ENA) Antibody, IgG: <0.2 AI (ref 0.0–0.9)
SSB (La) (ENA) Antibody, IgG: <0.2 AI (ref 0.0–0.9)
Scleroderma (Scl-70) (ENA) Antibody, IgG: <0.2 AI (ref 0.0–0.9)
ds DNA Ab: 15 [IU]/mL — ABNORMAL HIGH (ref 0–9)

## 2023-03-09 LAB — ANA W/REFLEX IF POSITIVE: Anti Nuclear Antibody (ANA): POSITIVE — AB

## 2023-05-01 ENCOUNTER — Encounter: Payer: Self-pay | Admitting: Nephrology

## 2023-05-02 ENCOUNTER — Other Ambulatory Visit (HOSPITAL_COMMUNITY): Payer: Self-pay | Admitting: Nephrology

## 2023-05-02 DIAGNOSIS — R809 Proteinuria, unspecified: Secondary | ICD-10-CM

## 2023-05-17 ENCOUNTER — Other Ambulatory Visit: Payer: Self-pay

## 2023-05-17 DIAGNOSIS — Z01818 Encounter for other preprocedural examination: Secondary | ICD-10-CM

## 2023-05-17 NOTE — Progress Notes (Signed)
 Patient for US  guided Renal Biopsy on Friday 05/18/23, I called and spoke with the patient's Son, Joshua Galvan on the phone and gave pre-procedure instructions. Joshua Galvan was made aware to have the patient here at 9a, NPO after MN prior to procedure as well as driver post procedure/recovery/discharge. Joshua Galvan stated understanding.  Called 05/17/23  INTERPRETER REQUESTED

## 2023-05-18 ENCOUNTER — Ambulatory Visit
Admission: RE | Admit: 2023-05-18 | Discharge: 2023-05-18 | Disposition: A | Discharge status: Home / Self Care | Source: Ambulatory Visit | Attending: Nephrology | Admitting: Nephrology

## 2023-05-18 DIAGNOSIS — Z7984 Long term (current) use of oral hypoglycemic drugs: Secondary | ICD-10-CM | POA: Diagnosis not present

## 2023-05-18 DIAGNOSIS — R809 Proteinuria, unspecified: Secondary | ICD-10-CM | POA: Insufficient documentation

## 2023-05-18 DIAGNOSIS — I129 Hypertensive chronic kidney disease with stage 1 through stage 4 chronic kidney disease, or unspecified chronic kidney disease: Secondary | ICD-10-CM | POA: Diagnosis not present

## 2023-05-18 DIAGNOSIS — E785 Hyperlipidemia, unspecified: Secondary | ICD-10-CM | POA: Diagnosis not present

## 2023-05-18 DIAGNOSIS — E1122 Type 2 diabetes mellitus with diabetic chronic kidney disease: Secondary | ICD-10-CM | POA: Diagnosis not present

## 2023-05-18 DIAGNOSIS — N1831 Chronic kidney disease, stage 3a: Secondary | ICD-10-CM | POA: Insufficient documentation

## 2023-05-18 DIAGNOSIS — Z01818 Encounter for other preprocedural examination: Secondary | ICD-10-CM

## 2023-05-18 LAB — CBC
HCT: 28.5 % — ABNORMAL LOW (ref 39.0–52.0)
Hemoglobin: 9.9 g/dL — ABNORMAL LOW (ref 13.0–17.0)
MCH: 30.3 pg (ref 26.0–34.0)
MCHC: 34.7 g/dL (ref 30.0–36.0)
MCV: 87.2 fL (ref 80.0–100.0)
Platelets: 268 10*3/uL (ref 150–400)
RBC: 3.27 MIL/uL — ABNORMAL LOW (ref 4.22–5.81)
RDW: 14 % (ref 11.5–15.5)
WBC: 8.7 10*3/uL (ref 4.0–10.5)
nRBC: 0 % (ref 0.0–0.2)

## 2023-05-18 LAB — PROTIME-INR
INR: 1.1 (ref 0.8–1.2)
Prothrombin Time: 14.5 s (ref 11.4–15.2)

## 2023-05-18 LAB — GLUCOSE, CAPILLARY: Glucose-Capillary: 106 mg/dL — ABNORMAL HIGH (ref 70–99)

## 2023-05-18 MED ORDER — LIDOCAINE HCL (PF) 1 % IJ SOLN
10.0000 mL | Freq: Once | INTRAMUSCULAR | Status: AC
Start: 2023-05-18 — End: 2023-05-18
  Administered 2023-05-18: 10 mL via INTRADERMAL
  Filled 2023-05-18: qty 10

## 2023-05-18 MED ORDER — FENTANYL CITRATE (PF) 100 MCG/2ML IJ SOLN
INTRAMUSCULAR | Status: AC
  Filled 2023-05-18: qty 2

## 2023-05-18 MED ORDER — MIDAZOLAM HCL 2 MG/2ML IJ SOLN
INTRAMUSCULAR | Status: AC | PRN
  Administered 2023-05-18: 1 mg via INTRAVENOUS

## 2023-05-18 MED ORDER — SODIUM CHLORIDE 0.9 % IV SOLN: INTRAVENOUS | Status: DC

## 2023-05-18 MED ORDER — MIDAZOLAM HCL 2 MG/2ML IJ SOLN
INTRAMUSCULAR | Status: AC
  Filled 2023-05-18: qty 2

## 2023-05-18 MED ORDER — FENTANYL CITRATE (PF) 100 MCG/2ML IJ SOLN
INTRAMUSCULAR | Status: AC | PRN
  Administered 2023-05-18: 50 ug via INTRAVENOUS

## 2023-05-18 NOTE — Procedures (Signed)
 Interventional Radiology Procedure Note  Procedure: US  guided biopsy of left kidney, medical renal Complications: None EBL: None Recommendations: - Bedrest 2 hours.   - Routine wound care - Follow up pathology - Advance diet  - ok to restart all home meds  Signed,  Myrlene Asper, DO

## 2023-05-18 NOTE — H&P (Signed)
 Chief Complaint: Proteinuria; request for image guided random renal biopsy  Referring Provider(s): Clementine Cutting   Supervising Physician: Myrlene Asper  Patient Status: [facility] - Out-pt  History of Present Illness: Joshua Galvan is a 63 y.o. male with PMH significant for CKD 3a, HLD, HTN, and DM (glipizide ). He was hospitalized 03/01/23-03/06/23 for AKI after influenza A and discharged with nephrology f/u. Renal US  03/01/23 did not suggest hydronephrosis or obstruction. Nephrologist requests IR random renal biopsy as part of ongoing evaluation in patient with persistent proteinuria. Baseline creat unknown (1.2 in 2023), fluctuated 3.2-3.7 while admitted.  He is spanish speaking Tolerates RA baseline, not on CPAP. Wife notes snoring and brief apnea at night consistent with sleep apnea, but he has not been formally diagnosed. Not on a blood thinner. SBP 140-150 at home most recently. Denies fever, chill, N/V, abd pain, hematuria, dysuria. See below for full ROS.  Patient is Full Code  Past Medical History:  Diagnosis Date   Diabetes mellitus without complication (HCC)    Hyperlipidemia    Hypertension     No past surgical history on file.  Allergies: Patient has no known allergies.  Medications: Prior to Admission medications   Medication Sig Start Date End Date Taking? Authorizing Provider  acetaminophen  (TYLENOL ) 500 MG tablet Take 500-1,500 mg by mouth every 6 (six) hours as needed for mild pain (pain score 1-3) or moderate pain (pain score 4-6).    [provider]  amLODipine  (NORVASC ) 5 MG tablet Take 1 tablet (5 mg total) by mouth daily. 03/07/23   Regalado, Belkys A, MD  Blood Glucose Monitoring Suppl DEVI 1 each by Does not apply route in the morning, at noon, and at bedtime. May substitute to any manufacturer covered by patient's insurance. 03/06/23   Regalado, Belkys A, MD  butalbital -acetaminophen -caffeine  (FIORICET ) 50-325-40 MG tablet Take 1-2  tablets by mouth every 6 (six) hours as needed for headache. 02/27/23 02/27/24  Bryson Carbine, MD  glipiZIDE  2.5 MG TABS Take 1 tablet by mouth daily at 12 noon. 03/06/23 04/05/23  Regalado, Belkys A, MD  sodium bicarbonate  650 MG tablet Take 1 tablet (650 mg total) by mouth 2 (two) times daily. 03/06/23   Regalado, Brigido Canales, MD     No family history on file.  Social History   Socioeconomic History   Marital status: Married    Spouse name: Not on file   Number of children: Not on file   Years of education: Not on file   Highest education level: Not on file  Occupational History   Not on file  Tobacco Use   Smoking status: Never   Smokeless tobacco: Never  Substance and Sexual Activity   Alcohol use: Yes    Comment: weekends   Drug use: Never   Sexual activity: Not on file  Other Topics Concern   Not on file  Social History Narrative   Not on file   Social Drivers of Health   Financial Resource Strain: Not on file  Food Insecurity: Patient Declined (03/01/2023)   Hunger Vital Sign    Worried About Running Out of Food in the Last Year: Patient declined    Ran Out of Food in the Last Year: Patient declined  Transportation Needs: Patient Declined (03/01/2023)   PRAPARE - Administrator, Civil Service (Medical): Patient declined    Lack of Transportation (Non-Medical): Patient declined  Physical Activity: Not on file  Stress: Not on file  Social Connections: Unknown (03/01/2023)  Social Advertising account executive [NHANES]    Frequency of Communication with Friends and Family: More than three times a week    Frequency of Social Gatherings with Friends and Family: Not on file    Attends Religious Services: Not on file    Active Member of Clubs or Organizations: Not on file    Attends Banker Meetings: Not on file    Marital Status: Not on file     Review of Systems: A 12 point ROS discussed and pertinent positives are indicated in the HPI above.  All  other systems are negative.  Review of Systems  Constitutional:  Negative for chills, fatigue and fever.  HENT:  Negative for sore throat.   Respiratory:  Negative for shortness of breath.   Cardiovascular:  Positive for leg swelling. Negative for chest pain.  Gastrointestinal:  Negative for abdominal pain, blood in stool, nausea and vomiting.  Genitourinary:  Negative for decreased urine volume and hematuria.       Foamy urine  Hematological:  Does not bruise/bleed easily.    Vital Signs: Vitals:   05/18/23 1039 05/18/23 1041  BP: (!) 156/85 (!) 156/85  Pulse: 86 84  Resp: 11 10  SpO2: 100% 100%        Physical Exam HENT:     Mouth/Throat:     Mouth: Mucous membranes are moist.     Pharynx: Oropharynx is clear.  Cardiovascular:     Rate and Rhythm: Normal rate and regular rhythm.     Pulses: Normal pulses.     Heart sounds: Normal heart sounds.  Pulmonary:     Effort: Pulmonary effort is normal.     Breath sounds: Normal breath sounds.  Abdominal:     Palpations: Abdomen is soft.     Tenderness: There is no abdominal tenderness.  Musculoskeletal:     Right lower leg: Edema present.     Left lower leg: Edema present.  Skin:    General: Skin is warm and dry.     Coloration: Skin is not jaundiced.  Neurological:     Mental Status: He is alert and oriented to person, place, and time.  Psychiatric:        Mood and Affect: Mood normal.        Behavior: Behavior normal.        Thought Content: Thought content normal.        Judgment: Judgment normal.     Imaging: No results found.  Labs:  CBC: Recent Labs    03/04/23 0450 03/05/23 0405 03/06/23 0437 05/18/23 0951  WBC 7.1 6.5 7.0 8.7  HGB 9.6* 9.2* 9.5* 9.9*  HCT 28.3* 27.8* 29.6* 28.5*  PLT 357 308 301 268    COAGS: Recent Labs    05/18/23 0951  INR 1.1    BMP: Recent Labs    03/03/23 0503 03/04/23 0450 03/05/23 0405 03/06/23 0437  NA 132* 139 136 135  K 3.9 3.8 3.8 3.7  CL 106 112*  110 110  CO2 18* 19* 19* 16*  GLUCOSE 361* 134* 226* 102*  BUN 39* 31* 29* 25*  CALCIUM  7.6* 8.2* 7.9* 8.0*  CREATININE 3.49* 3.47* 3.75* 3.53*  GFRNONAA 19* 19* 18* 19*    LIVER FUNCTION TESTS: Recent Labs    03/01/23 1922 03/02/23 0423  BILITOT 0.5  --   AST 15  --   ALT 12  --   ALKPHOS 104  --   PROT 6.4*  --  ALBUMIN 2.4* 1.9*    Assessment and Plan:  Request for image guided random renal biopsy approved for 05/18/23. No contraindications for procedure identified in ROS, PE, or pre-sedation check list. 03/01/23 Renal US  available and reviewed. BP within acceptable limits, afebrile. He is not on a blood thinner.   05/18/23 CBC, INR, CMP within acceptable range.   Risks and benefits of random renal biopsy was discussed with the patient and/or patient's family including, but not limited to potentially life threatening bleeding requiring additional procedure and extended length of hospital stay, infection, damage to adjacent structures or low yield requiring additional tests.  All of the questions were answered and there is agreement to proceed.  Consent signed and in chart.   Entirety of exam, interview, and consent performed with in-person Spanish interpreter at bedside.   Thank you for allowing our service to participate in Luismario Linken Mcglothen 's care.    Electronically Signed: Terressa Fess, NP   05/18/2023, 10:36 AM     I spent a total of  30 Minutes   in face to face in clinical consultation, greater than 50% of which was counseling/coordinating care for image guided random renal biopsy   (A copy of this note was sent to the referring provider and the time of visit.)

## 2023-05-18 NOTE — Progress Notes (Signed)
 Patient clinically stable post US  renal biopsy, tolerated well with stable vitals pre and post procedure. Denies complaints post procedure. Received Versed  1 mg along with Fentanyl  50 mcg IV for procedure. Report given to Jann Parker RN post procedure/specials/2.

## 2023-05-23 ENCOUNTER — Encounter: Payer: Self-pay | Admitting: Nephrology

## 2023-05-24 LAB — SURGICAL PATHOLOGY

## 2023-06-01 ENCOUNTER — Other Ambulatory Visit (HOSPITAL_COMMUNITY): Payer: Self-pay | Admitting: Nephrology

## 2023-06-01 ENCOUNTER — Encounter (HOSPITAL_COMMUNITY): Payer: Self-pay | Admitting: Nephrology

## 2023-06-01 DIAGNOSIS — R809 Proteinuria, unspecified: Secondary | ICD-10-CM

## 2023-06-25 NOTE — Consults (Signed)
 ------------------------------------------------------------------------------- Attestation signed by Joshua Odella Ahr, MD at 06/26/23 1001  Pt was seen at the request of Joshua Gaylan Bob, MD [facility] - General Medicine Floor Team (MED W - Tower) Joshua Galvan (MDW)) AKI vs progressive CKD, severe. Unable to perform renal biopsy due to prominent blood vessels and pyramids as well as thin cortex.  Urine sediment not concerning for active nephritis and feel that risk of biopsy outweighs benefit at this time.  I saw and evaluated the patient, participating in the key portions of the service.  I reviewed the resident's note.  I agree with the resident's findings and plan. [name], MD      -------------------------------------------------------------------------------    Nephrology Consult Note  Requesting Attending Physician :  Joshua Gaylan Bob, MD Service Requesting Consult : Med General Welt (MDW) Reason for Consult: AKI on CKD, nephrotic-range proteinuria  Assessment and Plan:  # Nephrotic range proteinuria # AKI on CKD IV vs CKD progression Baseline sCr presumed around 3.5. Admitted in 02/2023 to [facility] for AKI (actually likely CKD) in s/o influenza infection, sinusitis. Known signifcant PMHx of uncontrolled HTN, T2DM and workup at that time with UPCR 8.6 g/g ANA positive, dsDNA 11 and negative ANCA, anti-GBM, Hep B/C, monoclonal gammopathy. Renal US  with increase echogenicity c/w CKD, no hydronephrosis. Patient discharged and underwent outpt renal bx on 05/18/23 that was non-diagnostic as it was medullary tissue. Present with anarasrca, AHRF and found to have Cr 4.86, UA with 300 protein-mild blood-2 RBC, UPCR 9.96, and renal US  with focal caliectasis of R kidney. Urine sediment evaluation on 6/2 with monomorphic RBCs and few WBCs not concerning for glomerular process.Received IV lasix 60 mg in ED with 1L UOP, IV lasix 120 mg with 1.5L UOP and sCr 4.66. At this  time, unclear if patient with AKI due to volume overload versus progression of CKD.  However, given his nephrotic range proteinuria of unknown etiology (ddx including diabetic nephropathy vs FSGS - primary or secondary given vs lupus nephropathy) decided to proceed with renal biopsy. Attempted by nephrology team on 6/2 but unable to find a good window. Given his 20+ years history of uncontrolled diabetes mellitus associated with retinopathy and neuropathy suspect diabetic kidney disease as the major driver. All serologies otherwise are negative. Kidney function remains stable ~4.8 mg/dl. At this point with clear history and bland urine sediment would not pursue a VIR biopsy as risk of obtaining a biopsy versus benefits remains high.   #HTN: Currently hypertensive, volume overload likely contributing.  Home regimen amlodipine  10 Mg daily, Lasix 40 Mg twice daily, losartan 25 Mg daily - Cont home amlodipine  10 mg every day - Agree with holding ARB, will restart based on renal function trend - Agree with current diuresis regimen   # AHRF # Pulmonary edema - Diuresis as above  # Anemia: Likely iron deficiency anemia 2/2 CKD - Iron replacement as you are  RECOMMENDATIONS:  - Given his 20+ years history of uncontrolled diabetes mellitus associated with retinopathy and neuropathy suspect diabetic kidney disease as the major driver. All serologies otherwise are negative. Kidney function remains stable ~4.8 mg/dl. At this point with clear history and bland urine sediment would not pursue a VIR biopsy as risk of obtaining a biopsy versus benefits remains high - Agree with current diuretic regimen - Will plan on outpatient follow as dialysis in the near future is highly likely (KFRE at 2 years is 88%) - Continue iron repletion as you are - We will  continue to follow.   Joshua Bathe, MD 06/25/2023 3:35 PM   Medical decision-making for 06/25/23  Findings / Data    Patient has: []  acute illness w/systemic  sxs  [mod] []  two or more stable chronic illnesses [mod] []  one chronic illness with acute exacerbation [mod] []  acute complicated illness  [mod] []  Undiagnosed new problem with uncertain prognosis  [mod] [x]  illness posing risk to life or bodily function (ex. AKI)  [high] []  chronic illness with severe exacerbation/progression  [high] []  chronic illness with severe side effects of treatment  [high] AKI Probs At least 2:  Probs, Data, Risk  I reviewed: [x]  primary team note []  consultant note(s) []  external records [x]  chemistry results [x]  CBC results []  blood gas results []  Other []  procedure/op note(s)  [x]  radiology report(s) []  micro result(s) []  w/ independent historian(s) AKI on CKD vs CKD Pulm edema Anemia Proteinuria >=3 Data Review (2 of 3)   I independently interpreted: []  Urine Sediment []  Renal US  []  CXR Images []  CT Images []  Other []  EKG Tracing Urine sediment bland Any    I discussed: []  Pathology results w/ QHPs(s) from other specialties []  Procedural findings w/ QHPs(s) from other specialties []  Imaging w/ QHP(s) from other specialties [x]  Treatment plan w/ QHP(s) from other specialties Plan discussed with primary team Any    Mgm't requires: []  Prescription drug(s)  [mod] [x]  Kidney biopsy  [mod] []  Central line placement  [mod] [x]  High risk medication use and/or intensive toxicity monitoring [high] []  Renal replacement therapy [high] []  High risk kidney biopsy  [high] []  Escalation of care  [high] []  High risk central line placement  [high] IV Diuresis: check BMP/Mg  Risk    ____________________________________________________  Interval Hx: Attempted renal biopsy this am. Unable to find a safe window. Kidney function stable with modest urine output.  Physical Exam:  Vitals:   06/25/23 1322 06/25/23 1337 06/25/23 1352 06/25/23 1407  BP: 122/79 140/74 140/73 126/78  Pulse:      Resp: 15 15 15 15   Temp: 36 C (96.8 F) 36.7 C (98.1 F) 36.5 C (97.7 F)  36.5 C (97.7 F)  TempSrc: Oral Oral Oral Oral  SpO2: 95% 98% 98% 95%  Weight:      Height:       No intake/output data recorded.  Intake/Output Summary (Last 24 hours) at 06/25/2023 1535 Last data filed at 06/24/2023 1657 Gross per 24 hour  Intake --  Output 150 ml  Net -150 ml   Constitutional: ill-appearing, no acute distress Heart: RRR, no m/r/g Lungs: Bibasilar crackles Abd: soft, non-tender, non-distended Ext: 2+ pitting edema

## 2023-06-26 NOTE — Discharge Summary (Signed)
 Physician Discharge Summary [facility] OBSERVATION [facility] [address] [place] [address] Dept: [phone] Loc: [phone]   Identifying Information:  Joshua Galvan [birth year] [id number]  Primary Care Physician: Antony Fines, PA   Code Status: Full Code  Admit Date: 06/23/2023  Discharge Date: 06/26/2023   Discharge To: Home  Discharge Service: [facility] - General Medicine Floor Team (MED [facility])   Discharge Attending Physician: Emmie Gaylan Bob, MD  Discharge Diagnoses:  Principal Problem:   CKD (chronic kidney disease) stage 5, GFR less than 15 ml/min   (POA: Unknown) Active Problems:   Volume overload (POA: Unknown)   Proteinuria (POA: Unknown)   Hypertension (POA: Unknown)   Diabetes mellitus type 2, noninsulin dependent   (POA: Unknown)   Anemia (POA: Unknown)   Hypoalbuminemia (POA: Unknown) Resolved Problems:   * No resolved hospital problems. [facility] Course:  Outpatient follow up: [ ]  paused losartan, consider alternative agent for additional BP control  [ ]  received 1st dose heplisav on 6/3, needs second dose of hep B vaccine at least 1 month later [ ]  recheck ferritin in 6+ weeks  Joshua Galvan is a 63 y.o. male whose presentation is complicated by hypertension, type 2 DM, and CKD stage 4-5 that presented to [facility] with two weeks of exertional dyspnea, orthopnea, and leg swelling, with Cr to >4.5.   CKD  Proteinuria Admitted with Cr 4.86, most recent available Cr 3.2 from OSH records. Elevated Cr and volume overload consistent with worsening renal failure. Reassuringly remained without acute indications for HD. Lytes wnl, had good response to diuresis as below. Seen by nephrology who completed GN workup. Renal bx reattempted but aborted due to poor windows between vasculature for safe biopsy. PVL vein mapping for HD access completed while inpatient. He was set up to establish care with [facility] at  discharge.  Anemia Hgb 9.8 on admission. Ferritin 79.7. Given IV iron. 10.5 on discharge.  Volume overload Pw orthopnea, DOE. CXR with pulmonary edema, proBNP elevated to 13K. Good resoponse to IV diuresis. Volume overload likely 2/2 progressive CKD, however echo with Grade II DD, possibly indicating some component of HFpEF contributing. Discharged with increased dose of 80 mg lasix daily - previously on 40 mg bid.   HTN Held home losartan 25 mg daily Continue home amlodpine 10 mg  DM A1c 7 on 5/31. Glipizide  held during admission, restarted on discharge.  The patient's hospital stay has been complicated by the following clinically significant conditions requiring additional evaluation and treatment or having a significant effect of this patient's care: - Anemia POA requiring further investigation or monitoring - Chronic kidney disease POA requiring further investigation, treatment, or monitoring   Outpatient Provider Follow Up Issues:  [ ]  paused losartan, consider alternative agent for additional BP control  Touchbase with Outpatient Provider: Warm Handoff: Completed on 06/26/23 by Daved Eon, MD  (Intern) via [facility] Message  Procedures: None ______________________________________________________________________ Discharge Medications:    Your Medication List     PAUSE taking these medications    losartan 25 MG tablet Wait to take this until your doctor or other care provider tells you to start again. Commonly known as: COZAAR Take 1 tablet (25 mg total) by mouth daily.       CHANGE how you take these medications    furosemide 80 MG tablet Commonly known as: LASIX Tome 1 pldora por va oral una vez al da. (Dosis = 80 mg) (Take 1 tablet (80 mg  total) by mouth daily.) Start taking on: June 27, 2023 What changed:  medication strength how much to take when to take this       CONTINUE taking these medications    amlodipine  10 MG tablet Commonly known  as: NORVASC  Take 1 tablet (10 mg total) by mouth daily.   blood sugar diagnostic Strp Use once daily. Use as instructed. Notes to patient: Use una vez al da. Use tal como se indica.   glipiZIDE  5 MG tablet Commonly known as: GLUCOTROL  Take 0.5 tablets (2.5 mg total) by mouth daily.   magnesium oxide 400 mg (241.3 mg elemental) tablet Commonly known as: MAG-OX Take 1 tablet (400 mg total) by mouth daily.   sodium bicarbonate  650 mg tablet Take 1 tablet (650 mg total) by mouth two (2) times a day.   VITAMIN D2-1,250 mcg (50,000 unit) 1,250 mcg (50,000 unit) capsule Generic drug: ergocalciferol-1,250 mcg (50,000 unit) Take 1 capsule (1,250 mcg total) by mouth once a week.        Allergies: Patient has no known allergies. ______________________________________________________________________ Pending Test Results: Pending Labs     Order Current Status   Anti-Nuclear Antibody (ANA) In process   Primary Membranous Nephropathy Cascade In process   Quant TB AG1 value In process   Quant TB AG2 value In process   Quant TB Mitogen value In process   Quant TB Nil value In process   Quantiferon TB Gold Plus In process   Quantiferon TB Gold Plus In process       Most Recent Labs: BMP - Results in Past 30 Days Result Component Current Result Ref Range Previous Result Ref Range  BUN 47 (H) (06/26/2023) 9 - 23 mg/dL 47 (H) (03/29/7972) 9 - 23 mg/dL  Chloride 891 (H) (03/28/7972) 98 - 107 mmol/L 111 (H) (06/25/2023) 98 - 107 mmol/L  CO2 21.0 (06/26/2023) 20.0 - 31.0 mmol/L 20.0 (06/25/2023) 20.0 - 31.0 mmol/L  Creatinine 4.48 (H) (06/26/2023) 0.73 - 1.18 mg/dL 5.21 (H) (03/29/7972) 9.26 - 1.18 mg/dL  Glucose 816 (H) (03/28/7972) 70 - 179 mg/dL 848 (03/29/7972) 70 - 820 mg/dL  Potassium 4.3 (03/28/7972) 3.4 - 4.8 mmol/L 4.5 (06/25/2023) 3.4 - 4.8 mmol/L  Sodium 139 (06/26/2023) 135 - 145 mmol/L 141 (06/25/2023) 135 - 145 mmol/L    Relevant Studies/Radiology: Echocardiogram W Colorflow Spectral Doppler   Final Result    US  Renal (Limited)  Final Result  Ultrasound-guided renal biopsy was cancelled due to unsafe windows for biopsy.              PVL Vessel Mapping For Hemodialysis Bilateral  Final Result    XR Chest 2 views  Final Result    Interstitial pulmonary edema and small bilateral pleural effusions.          US  Renal Complete  Final Result  Focal caliectasis of the right kidney.     No evidence of obstructive urinary tract dilation..                ______________________________________________________________________ Discharge Instructions:    Appointments which have been scheduled for you    Jul 06, 2023 10:00 AM Appointment with Antony Fines, PA [address]  [facility] [place] [address] [phone]  Hospital Follow Up  Additional instructions:   Tiene una cita de seguimiento despus de una hospitalizacin con Fines Antony, PA el 13 de junio de 2025 a las 10:00 a. m. Si necesita cambiar esta cita, llame a su consultorio.    [facility] [address] [place], [place] [address] [place]: [phone] ______________________________________________________________________________________________ Rosine are scheduled for a hospital follow up with Eric Dears, PA on 07/06/2023 at 10AM. Please give their office a call if you need to cancel/reschedule this visit.   [facility] [address] [place], [place] [address] Phone: [phone]     ______________________________________________________________________ Discharge Day Services: BP 152/79   Pulse 75   Temp 36.8 C (98.2 F) (Oral)   Resp 18   Ht 180.3 cm (5' 11)   Wt 82.9 kg (182 lb 12.2 oz)   SpO2 98%   BMI 25.49 kg/m   Pt seen on the day of discharge and determined appropriate for discharge.  Condition at Discharge: good  Length of Discharge: I spent greater than 30 mins in the discharge of this patient.  Daved Eon  MD Internal Medicine PGY1  I saw and evaluated the patient, participating in the key portions of the service on the day of discharge.  I reviewed the resident's note and agree with the discharge plans and disposition. I personally spent >30 minutes in discharge planning services. Emmie JULIANNA Bob, MD

## 2023-06-29 ENCOUNTER — Ambulatory Visit (HOSPITAL_COMMUNITY)

## 2023-06-29 ENCOUNTER — Encounter (HOSPITAL_COMMUNITY): Payer: Self-pay

## 2023-07-30 NOTE — Progress Notes (Signed)
 Referring Provider: Antony Fines, PA   PCP:  Antony Fines, [place]  ASSESSMENT/PLAN: Joshua Galvan is a 63 y.o. patient w/ PMH of CKD, HTN, DMT2, anemia who is being seen in clinic for CKD evaluation.   CKD stage G5 A3 Suspect secondary to long standing diabetes and HTN. Unable to get renal biopsy when admitted to [facility] in June 2025 due to presence of thin cortex, high risk of bleed. Urine sediment was bland, and serologic work up negative. Biopsy deemed not to be of benefit at this point. Does have significant proteinuria- UACR 5500 micrograms/mg. Cr is currently stable at 4.25 (EGFR 15). Discussed high risk of progressive renal dysfunction with patient and wife. Importance of blood pressure, blood sugar control, and planning for dialysis need in future. He had PVL for AV access planning inpatient, but he has insurance and could be potential PD candidate. I will refer to dialysis education class in [place]. He has been referred for transplant evaluation.  - ACE/ARB, sglt2i, ARB: EGFR too low at this point to safely start  - Appropriate candidate for glp1a, will discuss at next visit, will need to monitor for hypoglycemia.   - statin: LDL 127. Start lipitor 40 mg daily - bicarb level 22, continue bicarb supplementation - counseled on avoiding nephrotoxic agents including but not limited to NSAIDs and contrasted studies  Bone Mineral Metabolism:  Lab Results  Component Value Date   PTH 384.2 (H) 07/10/2023  Ca 8.1, albumin 2.5. Phos 4.8. Total vitamin D was low at 18 in mid June Taking vitamin D 1 capsule weekly started by PCP per patient   Hypertension: BP Readings from Last 3 Encounters:  07/30/23 177/89  07/10/23 159/80  06/26/23 152/79  Elevated at home as well. Currently on amlodipine  10 and lasix 80 mg daily, and coreg 6.25 mg bid. Increase coreg to 12.5 mg bid.  Has stable lower extremity edema- 1+   Anemia:  Received IV iron while admitted. Hgb stable at 10.6.  Ferritin 410. Tsat 23%- adequate iron stores.   DMT2: A1c 7 on 5/31. Taking glipizide  5 mg in AM.  Has CGM through PCP- BG running 95-100s. At night running 60-70 sometimes, advised to stop glipizide  if this keeps happening.  Appropriate candidate for glp1a, but would need to stop glipizide - will discuss at next visit.   Patient will return to clinic in 2 weeks. Anticipated labs: renal function panel  Selina Pons MD Division of Nephrology and Hypertension   Reason for visit: CKD, HTN  HPI:  Mr. Joshua Galvan is a 63 y.o. with a past medical history of CKD, HTN, anemia who presents to clinic for follow up of CKD and recent hospitalization.   Admitted to 06/23/23-06/26/23 with elevated Cr, see below:  CKD  Proteinuria Admitted with Cr 4.86, most recent available Cr 3.2 from OSH records. Elevated Cr and volume overload consistent with worsening renal failure. Reassuringly remained without acute indications for HD. Lytes wnl, had good response to diuresis as below. Seen by nephrology who completed GN workup. Renal bx reattempted but aborted due to poor windows between vasculature for safe biopsy. PVL vein mapping for HD access completed while inpatient. He was set up to establish care with [facility] at discharge. Anemia Hgb 9.8 on admission. Ferritin 79.7. Given IV iron. 10.5 on discharge. Volume overload Pw orthopnea, DOE. CXR with pulmonary edema, proBNP elevated to 13K. Good resoponse to IV diuresis. Volume overload likely 2/2 progressive CKD, however echo with Grade II DD, possibly  indicating some component of HFpEF contributing. Discharged with increased dose of 80 mg lasix daily - previously on 40 mg bid.  HTN Held home losartan 25 mg daily Continue home amlodpine 10 mg DM A1c 7 on 5/31. Glipizide  held during admission, restarted on discharge.  30 years of diabetes.  Unsure of control in past Has CGM- BG is 80-120.  Taking glipizide  once daily Quant TB gold  and Hep B negative.   Today: he presents for follow up with his wife.  Bp was 150/90 at home, but admits to not checking regularly. Bp is 183/94 in clinic today. Reports that he feels well.  I gave him dialysis education materials today.  Has 2+ edema to below knees, not bothersome to him, stable . Denies dyspnea or orthopnea.  Taking lasix 80 mg daily Had PCP appt recently, does not have follow up with them.  Reports hearing loss since February.    IMAGING STUDIES: Reviewed  ROS:  10 systems reviewed and negative unless otherwise noted  PAST MEDICAL HISTORY: Past Medical History[1]  ALLERGIES Patient has no known allergies.  SOCIAL HISTORY Social History [2]    FAMILY HISTORY Family History[3]   MEDICATIONS: Current Medications[4]  PHYSICAL EXAM:   Vitals:   07/30/23 1121  BP: 177/89  Pulse: 75  Temp: 36.3 C (97.3 F)   General: well-appearing, no acute distress HEENT: anicteric sclera CV: RRR, no m/r/g, 2+edema to knees Lungs: CTAB, normal wob Abd: soft, non-tender, non-distended MSK: no visible joint swelling, normal gait Skin: no visible lesions or rashes Psych: alert, engaged, appropriate mood and affect   Labs/studies Lab Results  Component Value Date   NA 139 07/10/2023   K 4.2 07/10/2023   CL 106 07/10/2023   CO2 20.0 07/10/2023   BUN 58 (H) 07/10/2023   CREATININE 4.63 (H) 07/10/2023   GLU 73 07/10/2023   CALCIUM  9.1 07/10/2023   PHOS 5.3 (H) 07/10/2023   ALBUMIN 3.0 (L) 07/10/2023     Creatinine trend: Creatinine  Date Value Ref Range Status  07/10/2023 4.63 (H) 0.73 - 1.18 mg/dL Final  93/96/7974 5.51 (H) 0.73 - 1.18 mg/dL Final  93/97/7974 5.21 (H) 0.73 - 1.18 mg/dL Final  93/98/7974 5.27 (H) 0.73 - 1.18 mg/dL Final  93/98/7974 5.33 (H) 0.73 - 1.18 mg/dL Final    No results found for: GFRAA  No results found for: GFRNONAA   No results found for: PHP, PLEUK, NITP, PROP, PGLU, KETONEPOC, BILP, BLDP, UROP   I  personally spent 45 minutes face-to-face and non-face-to-face in the care of this patient, which includes all pre, intra, and post visit time on the date of service.  All documented time was specific to the E/M visit and does not include any procedures that may have been performed.        [1] Past Medical History: Diagnosis Date  . Diabetes mellitus      . Hyperlipidemia   . Hypertension   [2] Social History Socioeconomic History  . Marital status: Married  Tobacco Use  . Smoking status: Never  . Smokeless tobacco: Never  Vaping Use  . Vaping status: Never Used  Substance and Sexual Activity  . Alcohol use: Yes    Comment: rarely  . Drug use: Never   Social Drivers of Corporate investment banker Strain: Low Risk  (06/25/2023)   Overall Financial Resource Strain (CARDIA)   . Difficulty of Paying Living Expenses: Not hard at all  Food Insecurity: No Food Insecurity (06/25/2023)  Hunger Vital Sign   . Worried About Programme researcher, broadcasting/film/video in the Last Year: Never true   . Ran Out of Food in the Last Year: Never true  Transportation Needs: No Transportation Needs (06/25/2023)   PRAPARE - Transportation   . Lack of Transportation (Medical): No   . Lack of Transportation (Non-Medical): No  Social Connections: Unknown (03/01/2023)   Received from [facility]   Social Connection and Isolation Panel   . In a typical week, how many times do you talk on the phone with family, friends, or neighbors?: More than three times a week  Housing: Low Risk  (06/25/2023)   Housing   . Within the past 12 months, have you ever stayed: outside, in a car, in a tent, in an overnight shelter, or temporarily in someone else's home (i.e. couch-surfing)?: No   . Are you worried about losing your housing?: No  [3] No family history on file. [4] Current Outpatient Medications  Medication Sig Dispense Refill  . amlodipine  (NORVASC ) 10 MG tablet Take 1 tablet (10 mg total) by mouth daily. 100 tablet 3  . blood  sugar diagnostic Strp Use once daily. Use as instructed.    . carvedilol (COREG) 6.25 MG tablet Take 1 tablet (6.25 mg total) by mouth two (2) times a day. 200 tablet 3  . furosemide (LASIX) 80 MG tablet Take 1 tablet (80 mg total) by mouth daily. 100 tablet 3  . glipiZIDE  (GLUCOTROL ) 5 MG tablet Take 0.5 tablets (2.5 mg total) by mouth daily.    . [Paused] losartan (COZAAR) 25 MG tablet Take 1 tablet (25 mg total) by mouth daily.    . sodium bicarbonate  650 mg tablet Take 1 tablet (650 mg total) by mouth two (2) times a day. 180 tablet 3   No current facility-administered medications for this visit.

## 2023-10-25 ENCOUNTER — Encounter: Payer: Self-pay | Admitting: Ophthalmology

## 2023-10-25 NOTE — Anesthesia Preprocedure Evaluation (Addendum)
 Anesthesia Evaluation  Patient identified by MRN, date of birth, ID band Patient awake    Reviewed: Allergy & Precautions, H&P , NPO status , Patient's Chart, lab work & pertinent test results  Airway Mallampati: III  TM Distance: <3 FB Neck ROM: Full    Dental no notable dental hx.    Pulmonary neg pulmonary ROS   Pulmonary exam normal breath sounds clear to auscultation       Cardiovascular hypertension, Normal cardiovascular exam Rhythm:Regular Rate:Normal  09-09-23 echo Summary   1. The left ventricle is normal in size with normal wall thickness.    2. The left ventricular systolic function is normal, LVEF is visually  estimated at > 55%.    3. There is mild mitral valve regurgitation.    4. The right ventricle is normal in size, with normal systolic function.    5. IVC size and inspiratory change suggest elevated right atrial pressure.  (10-20 mmHg).      Neuro/Psych negative neurological ROS  negative psych ROS   GI/Hepatic negative GI ROS, Neg liver ROS,,,  Endo/Other  diabetes    Renal/GU Renal diseasenegative Renal ROS  negative genitourinary   Musculoskeletal negative musculoskeletal ROS (+)    Abdominal   Peds negative pediatric ROS (+)  Hematology negative hematology ROS (+)   Anesthesia Other Findings 10-12-23 had AVF placed for diaysis Need BMP preop Has chest port Permcath, and left upper arm AVF Dialysis tomorrow per verbal from family member Diabetes mellitus Chronic heart failure with preserved ejection fraction HTN Hyperlipidemia Daughter states he snores and stops breathing at times, need to test for sleep apnea     Reproductive/Obstetrics negative OB ROS                              Anesthesia Physical Anesthesia Plan  ASA: 4  Anesthesia Plan: MAC   Post-op Pain Management:    Induction: Intravenous  PONV Risk Score and Plan:   Airway  Management Planned: Natural Airway and Nasal Cannula  Additional Equipment:   Intra-op Plan:   Post-operative Plan:   Informed Consent: I have reviewed the patients History and Physical, chart, labs and discussed the procedure including the risks, benefits and alternatives for the proposed anesthesia with the patient or authorized representative who has indicated his/her understanding and acceptance.     Dental Advisory Given  Plan Discussed with: Anesthesiologist, CRNA and Surgeon  Anesthesia Plan Comments: (Patient consented for risks of anesthesia including but not limited to:  - adverse reactions to medications - damage to eyes, teeth, lips or other oral mucosa - nerve damage due to positioning  - sore throat or hoarseness - Damage to heart, brain, nerves, lungs, other parts of body or loss of life  Patient voiced understanding and assent.)        Anesthesia Quick Evaluation

## 2023-10-30 ENCOUNTER — Encounter: Payer: Self-pay | Admitting: Ophthalmology

## 2023-10-30 NOTE — Discharge Instructions (Signed)
 El cuidado despu?s de una operaci?n de cataratas ?Cataract Surgery, Care After (Spanish) ? ?Esta hoja le da informaci?n sobre c?mo cuidarse despu?s de su cirug?a. Su oftalm?logo puede darle instrucciones m?s espec?ficas tambi?n. Si tiene problemas o preguntas, comun?quese con su doctor en [facility], [phone]. ? ??Qu? puedo esperar despu?s de la cirug?a? ?Es normal tener: ?Picaz?n ?Sensaci?n de tener un cuerpo extra?o (se siente como un grano de Investment banker, corporate ojo) ?Secreci?n acuosa (lagrimeo excesivo) ?Sensibilidad a la luz y al tacto ?Moretones en el ojo o a su alrededor ?Visi?n ligeramente borrosa ? ?Siga estas instrucciones en casa: ?No se toque ni se frote los ojos. ?Es posible que le digan que use una pantalla protectora o lentes de sol para proteger sus ojos. ?No se ponga un lente de contacto en el ojo operado hasta que su doctor lo apruebe. ?Mantenga los p?rpados y la cara limpios y secos. ?No deje que el agua le caiga directamente en la cara mientras se duche. ?Evite el jab?n y champ? en los ojos. ?No se maquille los ojos por [employer]. ? ?Rev?sese su ojo todos los d?as para ver si hay  signos de infecci?n. Est? atento(a): ?Enrojecimiento, hinchaz?n o dolor. ?L?quido, sangre o pus. ?Empeoramiento de la visi?n. ?Aumento de la sensibilidad a la luz o al tacto. ? ?Actividad: ?Physicist, medical d?a, evite agacharse y leer. Puede volver a leer y a agacharse al d?a siguiente. ?No maneje ni use maquinaria pesada durante al menos 24 horas. ?Evite las actividades vigorosas durante una semana. Est? bien Soil scientist, usar la cinta de correr, usar la bicicleta est?tica y [employer]. ?No levante objetos pesados (m?s de 20 libras) por una semana. ?No realice trabajos de jardiner?a ni tareas dom?sticas sucias en la casa (como limpiar los pisos, los ba?os, pasar la aspiradora, etc.) durante una semana. ?No nade ni use un jacuzzi durante 2 semanas.  ?Pregunte a su doctor cu?ndo  usted puede volver a trabajar. ? ?Instrucciones generales: ?Tome o aplique los medicamentos recetados y de venta libre seg?n le indique su doctor, incluyendo las gotas para los ojos y las pomadas. ?Contin?e tomando los medicamentos que fueron suspendidos antes de la cirug?a, a menos que su doctor le indique lo contrario. ?Vaya a todas sus citas de seguimiento que ya fueron programadas. ? ? ?P?ngase en contacto con un proveedor de atenci?n m?dica si: ?Le aparecen m?s moretones alrededor del ojo. ?Tiene dolor que no se Systems analyst. ?Tiene fiebre. ?Le sale l?quido, pus o sangre del ojo o de la incisi?n. ?Su sensibilidad a la Actor. ?Tiene manchas (moscas volantes) o destellos de luz en la vista. ?Tiene n?useas o v?mitos. ? ?Vaya al Departamento de Emergencias m?s cercana o llame al 911 si: ?Pierde la vista de forma repentina. ?Tiene un dolor de ojo que es intenso y que se est? empeorando. ?

## 2023-10-31 ENCOUNTER — Ambulatory Visit: Payer: Self-pay | Admitting: Anesthesiology

## 2023-10-31 ENCOUNTER — Encounter
Admission: RE | Disposition: A | Discharge status: Home / Self Care | Payer: Self-pay | Source: Home / Self Care | Attending: Ophthalmology

## 2023-10-31 ENCOUNTER — Ambulatory Visit
Admission: RE | Admit: 2023-10-31 | Discharge: 2023-10-31 | Disposition: A | Discharge status: Home / Self Care | Attending: Ophthalmology | Admitting: Ophthalmology

## 2023-10-31 ENCOUNTER — Encounter: Payer: Self-pay | Admitting: Ophthalmology

## 2023-10-31 ENCOUNTER — Other Ambulatory Visit: Payer: Self-pay

## 2023-10-31 ENCOUNTER — Other Ambulatory Visit
Admission: RE | Admit: 2023-10-31 | Discharge: 2023-10-31 | Disposition: A | Discharge status: Home / Self Care | Attending: Anesthesiology | Admitting: Anesthesiology

## 2023-10-31 DIAGNOSIS — N186 End stage renal disease: Secondary | ICD-10-CM | POA: Diagnosis not present

## 2023-10-31 DIAGNOSIS — Z01818 Encounter for other preprocedural examination: Secondary | ICD-10-CM

## 2023-10-31 DIAGNOSIS — E1136 Type 2 diabetes mellitus with diabetic cataract: Secondary | ICD-10-CM | POA: Diagnosis present

## 2023-10-31 DIAGNOSIS — Z992 Dependence on renal dialysis: Secondary | ICD-10-CM | POA: Diagnosis not present

## 2023-10-31 DIAGNOSIS — I132 Hypertensive heart and chronic kidney disease with heart failure and with stage 5 chronic kidney disease, or end stage renal disease: Secondary | ICD-10-CM | POA: Insufficient documentation

## 2023-10-31 DIAGNOSIS — I5032 Chronic diastolic (congestive) heart failure: Secondary | ICD-10-CM | POA: Diagnosis not present

## 2023-10-31 DIAGNOSIS — Z419 Encounter for procedure for purposes other than remedying health state, unspecified: Secondary | ICD-10-CM

## 2023-10-31 DIAGNOSIS — Z7984 Long term (current) use of oral hypoglycemic drugs: Secondary | ICD-10-CM | POA: Insufficient documentation

## 2023-10-31 DIAGNOSIS — E1122 Type 2 diabetes mellitus with diabetic chronic kidney disease: Secondary | ICD-10-CM | POA: Insufficient documentation

## 2023-10-31 DIAGNOSIS — H2511 Age-related nuclear cataract, right eye: Secondary | ICD-10-CM | POA: Diagnosis present

## 2023-10-31 HISTORY — DX: Chronic diastolic (congestive) heart failure: I50.32

## 2023-10-31 HISTORY — DX: End stage renal disease: N18.6

## 2023-10-31 HISTORY — PX: CATARACT EXTRACTION W/PHACO: SHX586

## 2023-10-31 LAB — BASIC METABOLIC PANEL WITH GFR
Anion gap: 11 (ref 5–15)
BUN: 46 mg/dL — ABNORMAL HIGH (ref 8–23)
CO2: 24 mmol/L (ref 22–32)
Calcium: 8.5 mg/dL — ABNORMAL LOW (ref 8.9–10.3)
Chloride: 100 mmol/L (ref 98–111)
Creatinine, Ser: 4.8 mg/dL — ABNORMAL HIGH (ref 0.61–1.24)
GFR, Estimated: 13 mL/min — ABNORMAL LOW (ref >60)
Glucose, Bld: 155 mg/dL — ABNORMAL HIGH (ref 70–99)
Potassium: 4.9 mmol/L (ref 3.5–5.1)
Sodium: 135 mmol/L (ref 135–145)

## 2023-10-31 LAB — GLUCOSE, CAPILLARY: Glucose-Capillary: 137 mg/dL — ABNORMAL HIGH (ref 70–99)

## 2023-10-31 SURGERY — PHACOEMULSIFICATION, CATARACT, WITH IOL INSERTION
Anesthesia: Monitor Anesthesia Care | Laterality: Right

## 2023-10-31 MED ORDER — ARMC OPHTHALMIC DILATING DROPS
OPHTHALMIC | Status: AC
  Filled 2023-10-31: qty 0.5

## 2023-10-31 MED ORDER — SIGHTPATH DOSE#1 NA HYALUR & NA CHOND-NA HYALUR IO KIT
PACK | INTRAOCULAR | Status: DC | PRN
Start: 2023-10-31 — End: 2023-10-31
  Administered 2023-10-31: 1 via OPHTHALMIC

## 2023-10-31 MED ORDER — MIDAZOLAM HCL 2 MG/2ML IJ SOLN
INTRAMUSCULAR | Status: AC
  Filled 2023-10-31: qty 2

## 2023-10-31 MED ORDER — LACTATED RINGERS IV SOLN
INTRAVENOUS | Status: DC
Start: 2023-10-31 — End: 2023-10-31

## 2023-10-31 MED ORDER — TETRACAINE HCL 0.5 % OP SOLN
1.0000 [drp] | OPHTHALMIC | Status: DC | PRN
Start: 2023-10-31 — End: 2023-10-31
  Administered 2023-10-31 (×3): 1 [drp] via OPHTHALMIC

## 2023-10-31 MED ORDER — MIDAZOLAM HCL 2 MG/2ML IJ SOLN
INTRAMUSCULAR | Status: DC | PRN
  Administered 2023-10-31: 1 mg via INTRAVENOUS

## 2023-10-31 MED ORDER — TETRACAINE HCL 0.5 % OP SOLN
OPHTHALMIC | Status: AC
  Filled 2023-10-31: qty 4

## 2023-10-31 MED ORDER — FENTANYL CITRATE (PF) 100 MCG/2ML IJ SOLN
INTRAMUSCULAR | Status: AC
  Filled 2023-10-31: qty 2

## 2023-10-31 MED ORDER — FENTANYL CITRATE (PF) 100 MCG/2ML IJ SOLN
INTRAMUSCULAR | Status: DC | PRN
  Administered 2023-10-31: 50 ug via INTRAVENOUS

## 2023-10-31 MED ORDER — ARMC OPHTHALMIC DILATING DROPS
1.0000 | OPHTHALMIC | Status: DC | PRN
  Administered 2023-10-31 (×3): 1 via OPHTHALMIC

## 2023-10-31 MED ORDER — CEFUROXIME OPHTHALMIC INJECTION 1 MG/0.1 ML
INJECTION | OPHTHALMIC | Status: DC | PRN
Start: 2023-10-31 — End: 2023-10-31
  Administered 2023-10-31: 1 mg via INTRACAMERAL

## 2023-10-31 MED ORDER — BRIMONIDINE TARTRATE-TIMOLOL 0.2-0.5 % OP SOLN
OPHTHALMIC | Status: DC | PRN
  Administered 2023-10-31: 1 [drp] via OPHTHALMIC

## 2023-10-31 MED ORDER — SIGHTPATH DOSE#1 BSS IO SOLN
INTRAOCULAR | Status: DC | PRN
Start: 2023-10-31 — End: 2023-10-31

## 2023-10-31 MED ORDER — SIGHTPATH DOSE#1 BSS IO SOLN
INTRAOCULAR | Status: DC | PRN
Start: 2023-10-31 — End: 2023-10-31
  Administered 2023-10-31: 15 mL via INTRAOCULAR

## 2023-10-31 MED ORDER — LIDOCAINE HCL (PF) 2 % IJ SOLN
INTRAOCULAR | Status: DC | PRN
Start: 2023-10-31 — End: 2023-10-31
  Administered 2023-10-31: 2 mL

## 2023-10-31 SURGICAL SUPPLY — 8 items
FEE CATARACT SUITE SIGHTPATH (MISCELLANEOUS) ×1 IMPLANT
GLOVE BIOGEL PI IND STRL 8 (GLOVE) ×1 IMPLANT
GLOVE SURG LX STRL 7.5 STRW (GLOVE) ×1 IMPLANT
GLOVE SURG SYN 6.5 PF PI BL (GLOVE) ×1 IMPLANT
LENS IOL TECNIS EYHANCE 21.0 (Intraocular Lens) IMPLANT
NDL FILTER BLUNT 18X1 1/2 (NEEDLE) ×1 IMPLANT
NEEDLE FILTER BLUNT 18X1 1/2 (NEEDLE) ×1 IMPLANT
SYR 3ML LL SCALE MARK (SYRINGE) ×1 IMPLANT

## 2023-10-31 NOTE — Anesthesia Postprocedure Evaluation (Signed)
 Anesthesia Post Note  Patient: Joshua Galvan  Procedure(s) Performed: PHACOEMULSIFICATION, CATARACT, WITH IOL INSERTION 14.44 01.14.1 (Right)  Patient location during evaluation: PACU Anesthesia Type: MAC Level of consciousness: awake and alert Pain management: pain level controlled Vital Signs Assessment: post-procedure vital signs reviewed and stable Respiratory status: spontaneous breathing, nonlabored ventilation, respiratory function stable and patient connected to nasal cannula oxygen Cardiovascular status: stable and blood pressure returned to baseline Postop Assessment: no apparent nausea or vomiting Anesthetic complications: no   No notable events documented.   Last Vitals:  Vitals:   10/31/23 0945 10/31/23 0953  BP: 135/67 137/70  Pulse:  71  Resp:  12  Temp: (!) 36.4 C   SpO2:  99%    Last Pain:  Vitals:   10/31/23 0953  TempSrc:   PainSc: 0-No pain                 Donny JAYSON Mu

## 2023-10-31 NOTE — Op Note (Signed)
 LOCATION:  [facility]   PREOPERATIVE DIAGNOSIS:    Nuclear sclerotic cataract right eye. H25.11   POSTOPERATIVE DIAGNOSIS:  Nuclear sclerotic cataract right eye.     PROCEDURE:  Phacoemusification with posterior chamber intraocular lens placement of the right eye   ULTRASOUND TIME: Procedure(s): PHACOEMULSIFICATION, CATARACT, WITH IOL INSERTION 14.44 01.14.1 (Right)  LENS:   Implant Name Type Inv. Item Serial No. Manufacturer Lot No. LRB No. Used Action  LENS IOL TECNIS EYHANCE 21.0 - [id number] Intraocular Lens LENS IOL TECNIS EYHANCE 21.0 [id number] SIGHT[employer]  Right 1 Implanted         SURGEON:  Dene FABIENE Etienne, MD   ANESTHESIA:  Topical with tetracaine drops and 2% Xylocaine  jelly, augmented with 1% preservative-free intracameral lidocaine .    COMPLICATIONS:  None.   DESCRIPTION OF PROCEDURE:  The patient was identified in the holding room and transported to the operating room and placed in the supine position under the operating microscope.  The right eye was identified as the operative eye and it was prepped and draped in the usual sterile ophthalmic fashion.   A 1 millimeter clear-corneal paracentesis was made at the 12:00 position.  0.5 ml of preservative-free 1% lidocaine  was injected into the anterior chamber. The anterior chamber was filled with Viscoat viscoelastic.  A 2.4 millimeter keratome was used to make a near-clear corneal incision at the 9:00 position.  A curvilinear capsulorrhexis was made with a cystotome and capsulorrhexis forceps.  Balanced salt solution was used to hydrodissect and hydrodelineate the nucleus.   Phacoemulsification was then used in stop and chop fashion to remove the lens nucleus and epinucleus.  The remaining cortex was then removed using the irrigation and aspiration handpiece. Provisc was then placed into the capsular bag to distend it for lens placement.  A lens was then injected into the capsular bag.  The remaining  viscoelastic was aspirated.   Wounds were hydrated with balanced salt solution.  The anterior chamber was inflated to a physiologic pressure with balanced salt solution.  No wound leaks were noted. Cefuroxime 0.1 ml of a 10mg /ml solution was injected into the anterior chamber for a dose of 1 mg of intracameral antibiotic at the completion of the case.   Timolol and Brimonidine drops were applied to the eye.  The patient was taken to the recovery room in stable condition without complications of anesthesia or surgery.   Brianda Beitler 10/31/2023, 9:44 AM

## 2023-10-31 NOTE — H&P (Signed)
 [facility]   Primary Care Physician:  System, Provider Not In Ophthalmologist: Dr. Dene Etienne  Pre-Procedure History & Physical: HPI:  Braydyn Dardan Shelton is a 63 y.o. male here for ophthalmic surgery.   Past Medical History:  Diagnosis Date   Chronic heart failure with preserved ejection fraction (HCC)    Diabetes mellitus without complication (HCC)    ESRD on hemodialysis (HCC)    Hyperlipidemia    Hypertension     Past Surgical History:  Procedure Laterality Date   AV FISTULA PLACEMENT, BRACHIOBASILIC Left     Prior to Admission medications   Medication Sig Start Date End Date Taking? Authorizing Provider  acetaminophen  (TYLENOL ) 500 MG tablet Take 500-1,500 mg by mouth every 6 (six) hours as needed for mild pain (pain score 1-3) or moderate pain (pain score 4-6).   Yes [provider]  amLODipine  (NORVASC ) 5 MG tablet Take 1 tablet (5 mg total) by mouth daily. Patient taking differently: Take 10 mg by mouth daily. 03/07/23  Yes Regalado, Belkys A, MD  atorvastatin (LIPITOR) 40 MG tablet Take 40 mg by mouth daily.   Yes [provider]  bumetanide (BUMEX) 2 MG tablet Take 2 mg by mouth daily.   Yes [provider]  butalbital -acetaminophen -caffeine  (FIORICET ) 50-325-40 MG tablet Take 1-2 tablets by mouth every 6 (six) hours as needed for headache. 02/27/23 02/27/24 Yes Arlander Charleston, MD  carvedilol (COREG) 12.5 MG tablet Take 12.5 mg by mouth 2 (two) times daily with a meal.   Yes [provider]  glipiZIDE  (GLUCOTROL  XL) 2.5 MG 24 hr tablet Take 2.5 mg by mouth daily with breakfast.   Yes [provider]  hydrALAZINE  (APRESOLINE ) 50 MG tablet Take 50 mg by mouth 3 (three) times daily.   Yes [provider]  sevelamer (RENAGEL) 800 MG tablet Take 800 mg by mouth 3 (three) times daily with meals.   Yes [provider]  Blood Glucose Monitoring Suppl DEVI 1 each by Does not apply route in the  morning, at noon, and at bedtime. May substitute to any manufacturer covered by patient's insurance. 03/06/23   Regalado, Owen A, MD    Allergies as of 10/02/2023   (No Known Allergies)    History reviewed. No pertinent family history.  Social History   Socioeconomic History   Marital status: Married    Spouse name: Not on file   Number of children: Not on file   Years of education: Not on file   Highest education level: Not on file  Occupational History   Not on file  Tobacco Use   Smoking status: Never   Smokeless tobacco: Never  Vaping Use   Vaping status: Never Used  Substance and Sexual Activity   Alcohol use: Not Currently   Drug use: Never   Sexual activity: Not on file  Other Topics Concern   Not on file  Social History Narrative   Not on file   Social Drivers of Health   Financial Resource Strain: Medium Risk (10/26/2023)   Received [facility]   Overall Financial Resource Strain (CARDIA)    How hard is it for you to pay for the very basics like food, housing, medical care, and heating?: Somewhat hard  Food Insecurity: No Food Insecurity (06/25/2023)   Received from [facility]   Hunger Vital Sign    Within the past 12 months, you worried that your food would run out before you got the money to buy  more.: Never true    Within the past 12 months, the food you bought just didn't last and you didn't have money to get more.: Never true  Transportation Needs: No Transportation Needs (06/25/2023)   Received from [facility] - [facility]    Lack of Transportation (Medical): No    Lack of Transportation (Non-Medical): No  Physical Activity: Not on file  Stress: Not on file  Social Connections: Unknown (03/01/2023)   Social Connection and Isolation Panel    Frequency of Communication with Friends and Family: More than three times a week    Frequency of Social Gatherings with Friends and Family: Not on file    Attends Religious Services:  Not on file    Active Member of Clubs or Organizations: Not on file    Attends Banker Meetings: Not on file    Marital Status: Not on file  Intimate Partner Violence: Not At Risk (06/23/2023)   Received from [facility]   Humiliation, Afraid, Rape, and Kick questionnaire    Within the last year, have you been afraid of your partner or ex-partner?: No    Within the last year, have you been humiliated or emotionally abused in other ways by your partner or ex-partner?: No    Within the last year, have you been kicked, hit, slapped, or otherwise physically hurt by your partner or ex-partner?: No    Within the last year, have you been raped or forced to have any kind of sexual activity by your partner or ex-partner?: No    Review of Systems: See HPI, otherwise negative ROS  Physical Exam: BP (!) 155/77   Temp 97.8 F (36.6 C) (Temporal)   Resp 16   Ht 5' 7.01 (1.702 m)   Wt 83.1 kg   SpO2 99%   BMI 28.70 kg/m  General:   Alert,  pleasant and cooperative in NAD Head:  Normocephalic and atraumatic. Lungs:  Clear to auscultation.    Heart:  Regular rate and rhythm.   Impression/Plan: Neale Gonzalez Gonzalez is here for ophthalmic surgery.  Risks, benefits, limitations, and alternatives regarding ophthalmic surgery have been reviewed with the patient.  Questions have been answered.  All parties agreeable.   MITTIE GASKIN, MD  10/31/2023, 8:55 AM

## 2023-10-31 NOTE — Transfer of Care (Signed)
 Immediate Anesthesia Transfer of Care Note  Patient: Joshua Galvan  Procedure(s) Performed: PHACOEMULSIFICATION, CATARACT, WITH IOL INSERTION (Right)  Patient Location: PACU  Anesthesia Type: MAC  Level of Consciousness: awake, alert  and patient cooperative  Airway and Oxygen Therapy: Patient Spontanous Breathing and Patient connected to supplemental oxygen  Post-op Assessment: Post-op Vital signs reviewed, Patient's Cardiovascular Status Stable, Respiratory Function Stable, Patent Airway and No signs of Nausea or vomiting  Post-op Vital Signs: Reviewed and stable  Complications: No notable events documented.

## 2023-11-01 ENCOUNTER — Encounter: Payer: Self-pay | Admitting: Ophthalmology

## 2023-12-05 ENCOUNTER — Telehealth: Payer: Self-pay

## 2023-12-05 NOTE — Telephone Encounter (Signed)
 Called patient to schedule his colonoscopy and he stated that he would call me back tomorrow.

## 2023-12-14 IMAGING — CR DG LUMBAR SPINE 2-3V
3 series · 3 of 3 positions shown · non-contrast
Comparison: None

CLINICAL DATA: New onset low back pain since yesterday, getting
worse

EXAM:
LUMBAR SPINE - 2-3 VIEW

[l-spine ap]
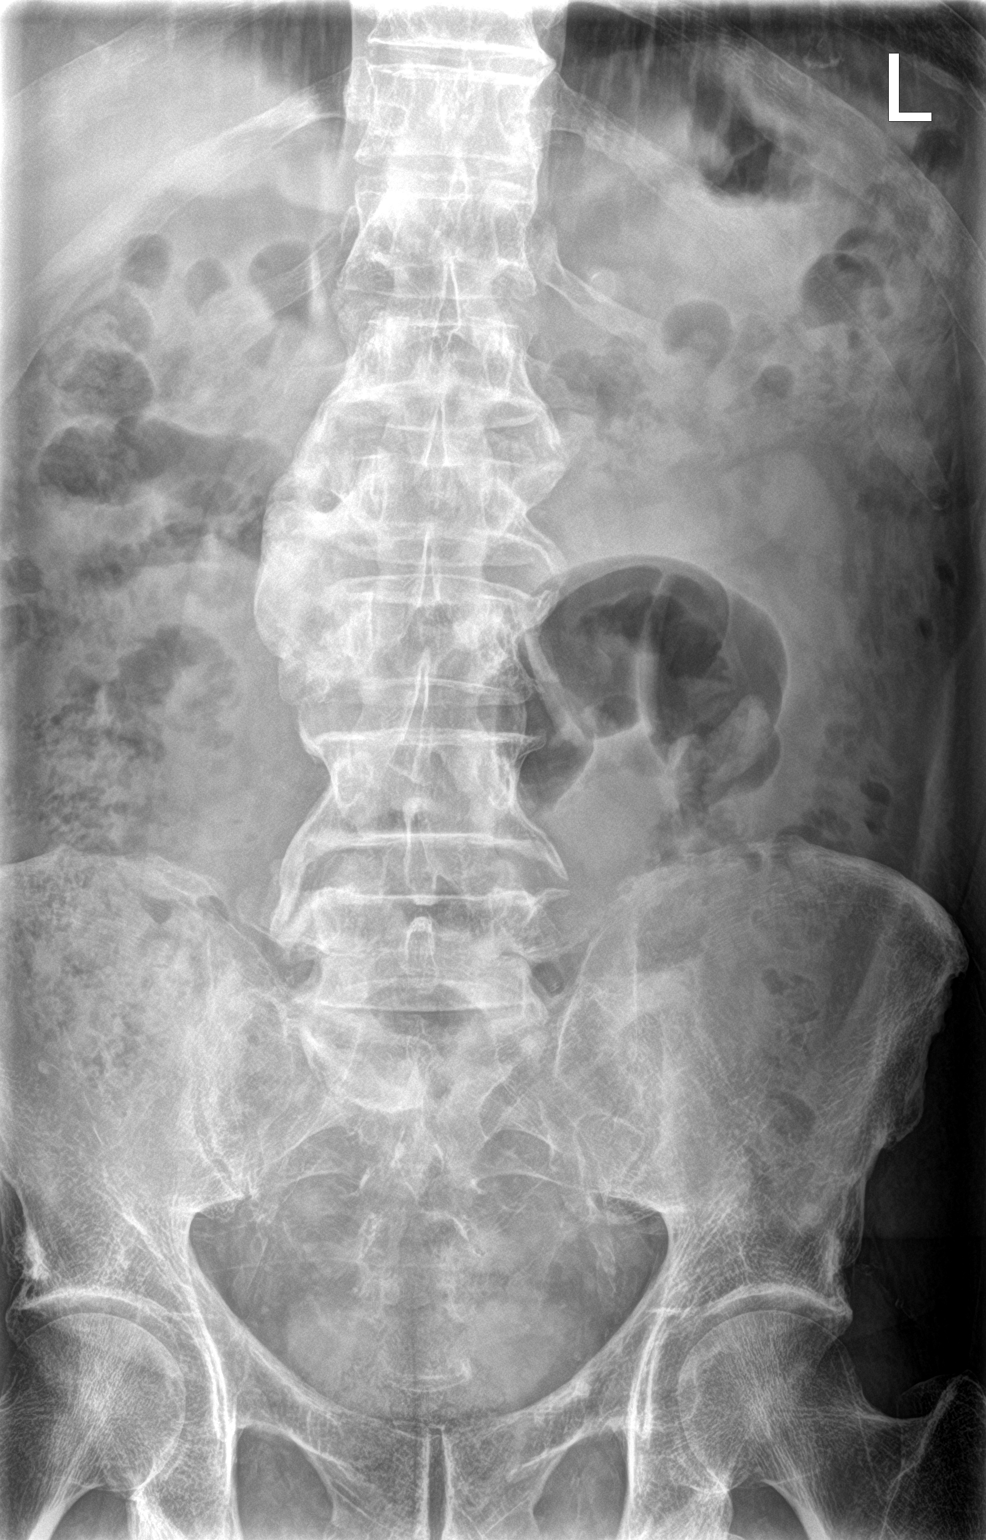

[l-spine lat]
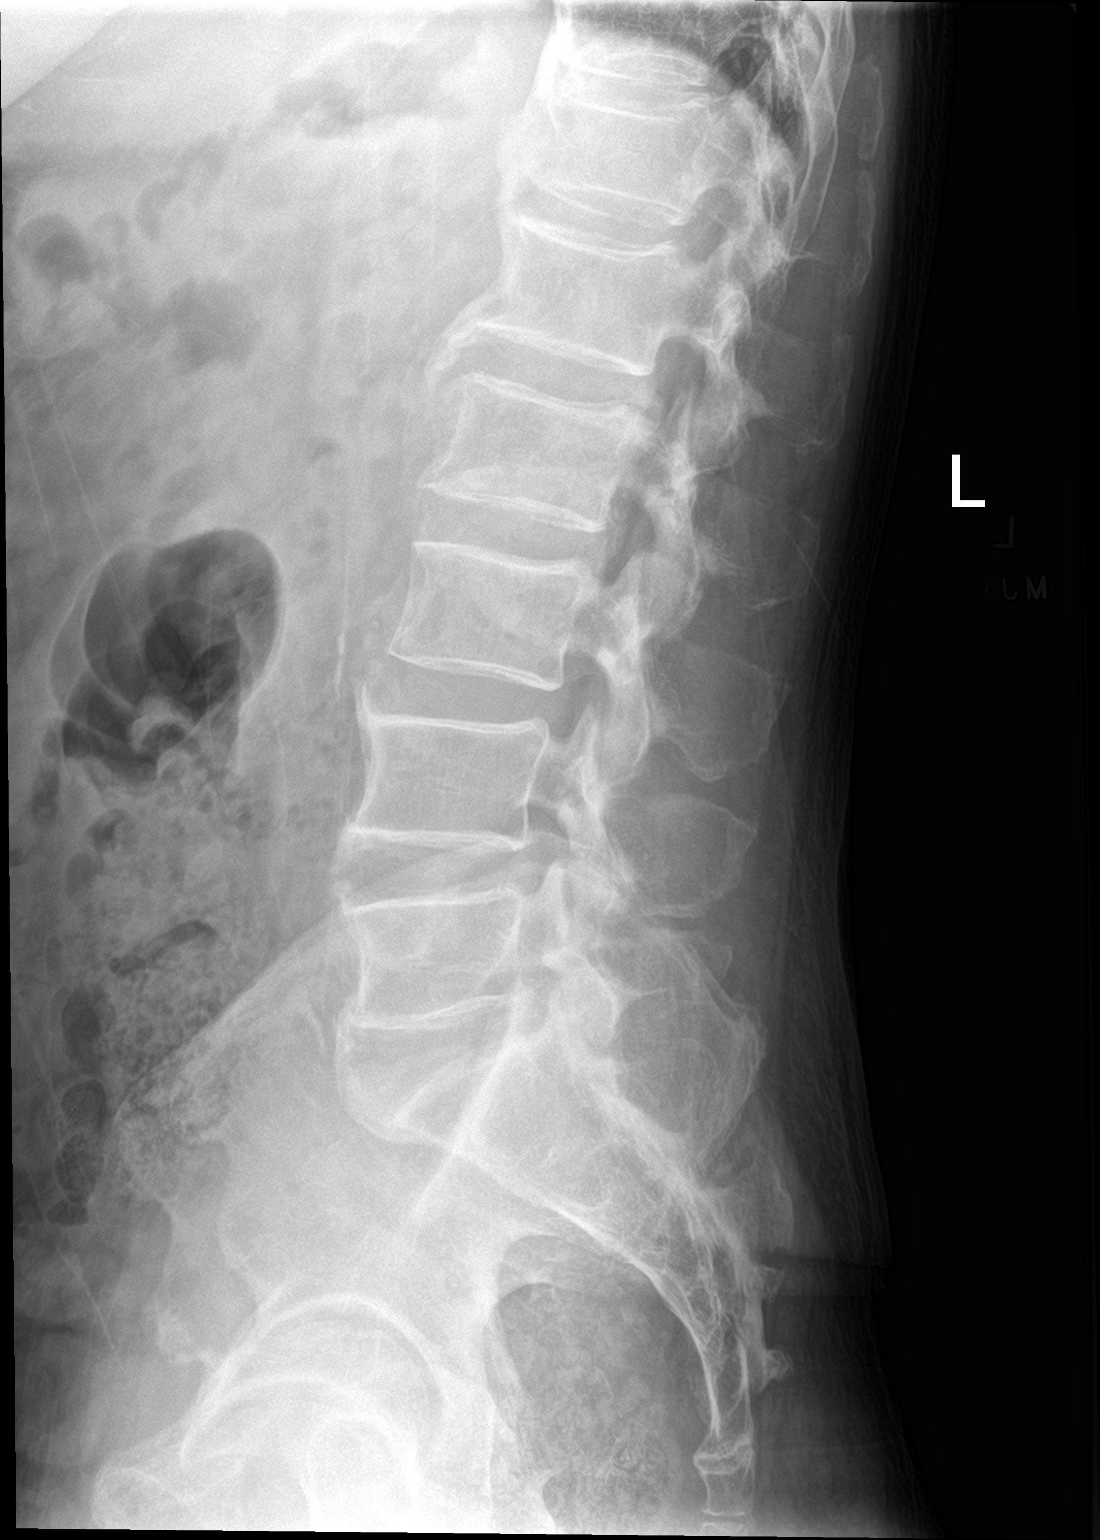

[l-spine spot]
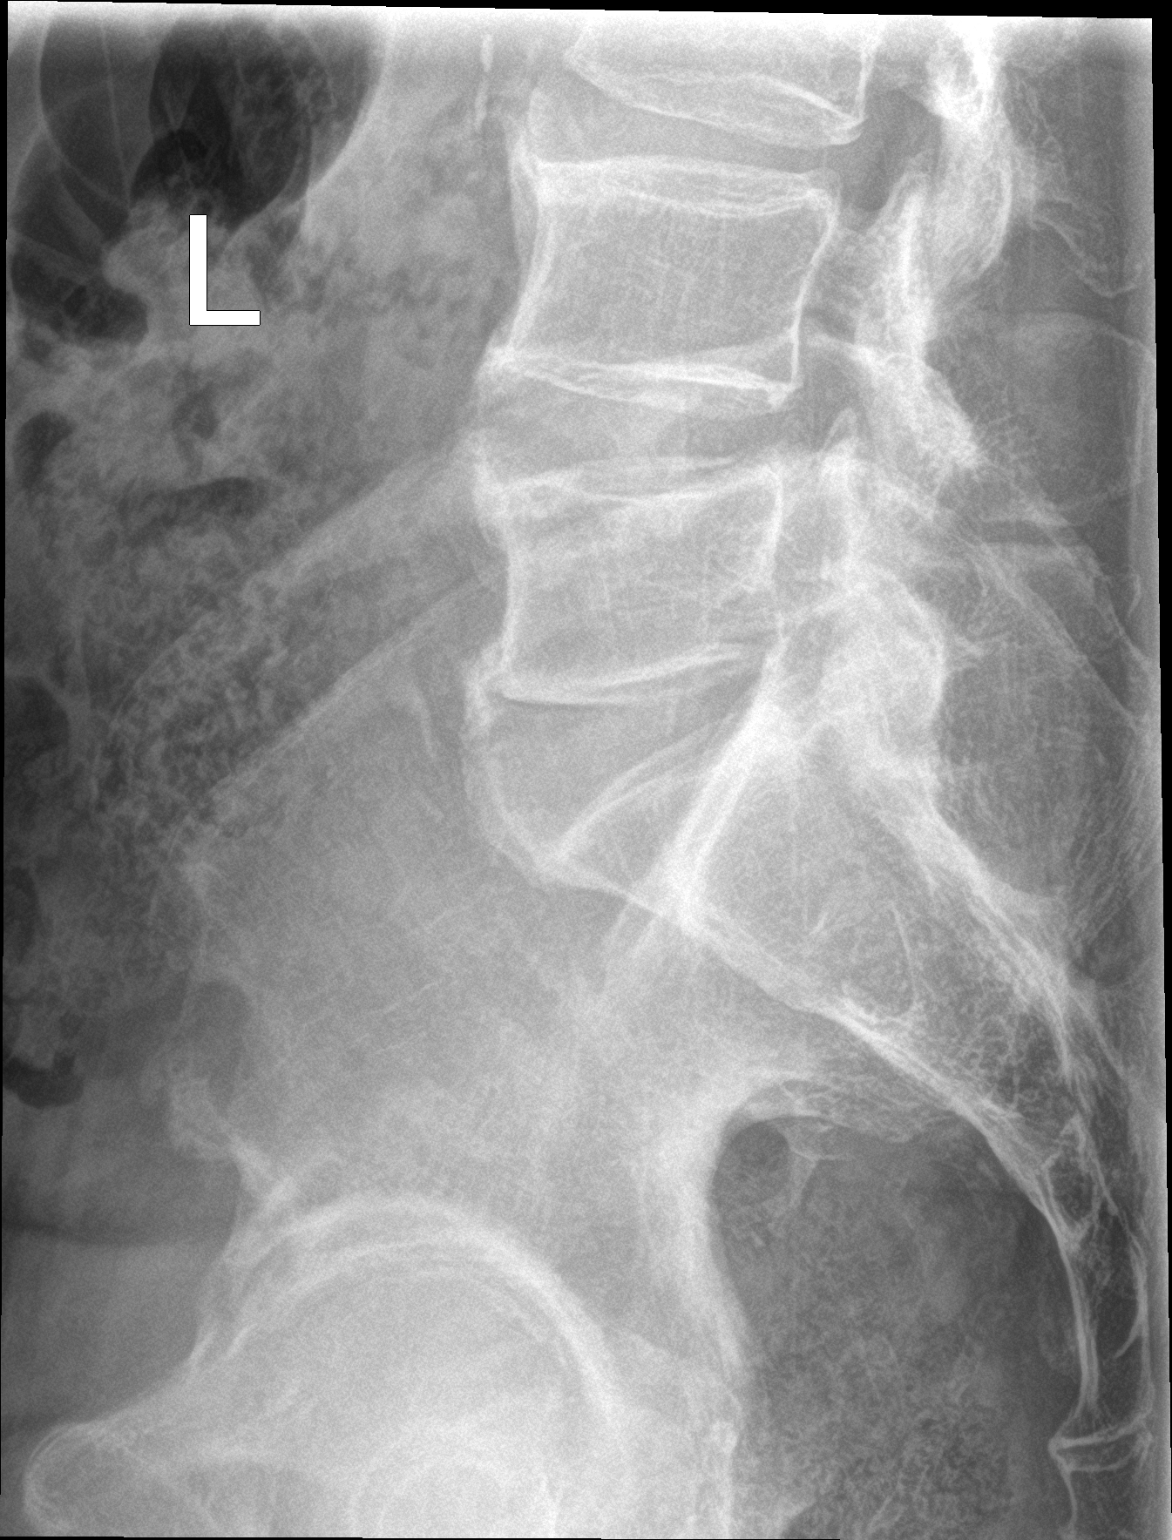

[3 of 3 positions shown; findings below may reference images not displayed]

FINDINGS: Five non-rib-bearing lumbar vertebra.

Vertebral body and disc space heights maintained.

Multilevel bulky endplate spur formation.

No fracture, subluxation, bone destruction, or spondylolysis.

SI joints preserved.
IMPRESSION: Degenerative changes lumbar spine.

No acute abnormalities.
